# Patient Record
Sex: Female | Born: 1955 | Hispanic: No | Marital: Married | Smoking: Never smoker
Health system: Southern US, Community
[De-identification: ages and names within clinical notes are randomized; demographics above are authoritative.]

## PROBLEM LIST (undated history)

## (undated) DIAGNOSIS — Z789 Other specified health status: Secondary | ICD-10-CM

## (undated) HISTORY — PX: COSMETIC SURGERY: SHX468

## (undated) HISTORY — DX: Other specified health status: Z78.9

---

## 2020-08-24 ENCOUNTER — Other Ambulatory Visit: Payer: Self-pay

## 2020-08-24 ENCOUNTER — Encounter: Payer: Self-pay | Admitting: Family Medicine

## 2020-08-24 ENCOUNTER — Ambulatory Visit (INDEPENDENT_AMBULATORY_CARE_PROVIDER_SITE_OTHER): Payer: Medicare Other | Admitting: Family Medicine

## 2020-08-24 VITALS — BP 138/87 | HR 78 | Temp 97.8°F | Ht 61.0 in | Wt 189.4 lb

## 2020-08-24 DIAGNOSIS — L819 Disorder of pigmentation, unspecified: Secondary | ICD-10-CM | POA: Diagnosis not present

## 2020-08-24 DIAGNOSIS — Z1211 Encounter for screening for malignant neoplasm of colon: Secondary | ICD-10-CM | POA: Diagnosis not present

## 2020-08-24 DIAGNOSIS — Z1212 Encounter for screening for malignant neoplasm of rectum: Secondary | ICD-10-CM

## 2020-08-24 DIAGNOSIS — Z6835 Body mass index (BMI) 35.0-35.9, adult: Secondary | ICD-10-CM | POA: Diagnosis not present

## 2020-08-24 DIAGNOSIS — K5900 Constipation, unspecified: Secondary | ICD-10-CM

## 2020-08-24 DIAGNOSIS — R030 Elevated blood-pressure reading, without diagnosis of hypertension: Secondary | ICD-10-CM | POA: Insufficient documentation

## 2020-08-24 DIAGNOSIS — Z780 Asymptomatic menopausal state: Secondary | ICD-10-CM | POA: Insufficient documentation

## 2020-08-24 NOTE — Progress Notes (Signed)
Subjective:  Patient ID: Ann Valdez, female    DOB: [birth year], 65 y.o.   MRN: [id number]  Patient Care Team: Baruch Gouty, FNP as PCP - General (Family Medicine)   Chief Complaint:  New Patient (Initial Visit) and Establish Care   HPI: Ann Valdez is a 65 y.o. female presenting on 08/24/2020 for New Patient (Initial Visit) and Establish Care   Pt is a pleasant 65 year old female who presents today to establish care with PCP. She states she has not seen a provider in several years. She reports she is very healthy overall and does not take any medications and does not wish to start unless absolutely necessary. Past surgeries include tummy tuck and breast reduction. She reports having a mammogram several years ago and declines further mammograms. She reports several areas of skin discoloration and lesions that have worsened over the last several years and would like to be referred to dermatology for further evaluation. States she has constipation and at times she has rectal pain with her bowel movements. Has not had a colonoscopy in last 10 years. Would like to be referred to GI for evaluation of constipation and rectal pain and for colonoscopy. She has not had a recent PAP but would like to see GYN for this. Aware this could be completed in office but pt insists on referral. She is postmenopausal, last menses age 72. No HRT and has not had DEXA scan.  She declines immunizations. No other complaints or concerns today.     Relevant past medical, surgical, family, and social history reviewed and updated as indicated.  Allergies and medications reviewed and updated. Date reviewed: Chart in Epic.   History reviewed. No pertinent past medical history.  Past Surgical History:  Procedure Laterality Date   COSMETIC SURGERY      Social History   Socioeconomic History   Marital status: Married    Spouse name: Not on file   Number of children: 3   Years of education: Not on  file   Highest education level: Not on file  Occupational History   Not on file  Tobacco Use   Smoking status: Never   Smokeless tobacco: Never  Vaping Use   Vaping Use: Never used  Substance and Sexual Activity   Alcohol use: Never   Drug use: Never   Sexual activity: Not on file  Other Topics Concern   Not on file  Social History Narrative   Not on file   Social Determinants of Health   Financial Resource Strain: Not on file  Food Insecurity: Not on file  Transportation Needs: Not on file  Physical Activity: Not on file  Stress: Not on file  Social Connections: Not on file  Intimate Partner Violence: Not on file    No outpatient encounter medications on file as of 08/24/2020.   No facility-administered encounter medications on file as of 08/24/2020.    Allergies  Allergen Reactions   Ciprofloxacin Rash    Review of Systems  Constitutional:  Negative for activity change, appetite change, chills, diaphoresis, fatigue, fever and unexpected weight change.  HENT: Negative.    Eyes: Negative.   Respiratory:  Negative for cough, chest tightness and shortness of breath.   Cardiovascular:  Negative for chest pain, palpitations and leg swelling.  Gastrointestinal:  Positive for constipation and rectal pain. Negative for abdominal distention, abdominal pain, anal bleeding, blood in stool, diarrhea, nausea and vomiting.  Endocrine: Negative.   Genitourinary:  Negative for  decreased urine volume, difficulty urinating, dyspareunia, dysuria, enuresis, flank pain, frequency, genital sores, hematuria, menstrual problem, pelvic pain, urgency, vaginal bleeding, vaginal discharge and vaginal pain.  Musculoskeletal:  Negative for arthralgias and myalgias.  Skin:  Positive for color change. Negative for pallor, rash and wound.  Allergic/Immunologic: Negative.   Neurological:  Negative for dizziness and headaches.  Hematological: Negative.   Psychiatric/Behavioral:  Negative for  confusion, hallucinations, sleep disturbance and suicidal ideas.   All other systems reviewed and are negative.      Objective:  BP 138/87   Pulse 78   Temp 97.8 F (36.6 C) (Temporal)   Ht '5\' 1"'  (1.549 m)   Wt 189 lb 6.4 oz (85.9 kg)   SpO2 97%   BMI 35.79 kg/m    Wt Readings from Last 3 Encounters:  08/24/20 189 lb 6.4 oz (85.9 kg)    Physical Exam Vitals and nursing note reviewed. Chaperone present: declined breast exam.  Constitutional:      General: She is not in acute distress.    Appearance: Normal appearance. She is well-developed and well-groomed. She is obese. She is not ill-appearing, toxic-appearing or diaphoretic.  HENT:     Head: Normocephalic and atraumatic.     Jaw: There is normal jaw occlusion.     Right Ear: Hearing, tympanic membrane, ear canal and external ear normal.     Left Ear: Hearing, tympanic membrane, ear canal and external ear normal.     Nose: Nose normal.     Mouth/Throat:     Lips: Pink.     Mouth: Mucous membranes are moist.     Pharynx: Oropharynx is clear. Uvula midline.  Eyes:     General: Lids are normal.     Extraocular Movements: Extraocular movements intact.     Conjunctiva/sclera: Conjunctivae normal.     Pupils: Pupils are equal, round, and reactive to light.  Neck:     Thyroid: No thyroid mass, thyromegaly or thyroid tenderness.     Vascular: No carotid bruit or JVD.     Trachea: Trachea and phonation normal.  Cardiovascular:     Rate and Rhythm: Normal rate and regular rhythm.     Chest Wall: PMI is not displaced.     Pulses: Normal pulses.     Heart sounds: Normal heart sounds. No murmur heard.   No friction rub. No gallop.  Pulmonary:     Effort: Pulmonary effort is normal. No respiratory distress.     Breath sounds: Normal breath sounds. No wheezing.  Abdominal:     General: Bowel sounds are normal. There is no distension or abdominal bruit.     Palpations: Abdomen is soft. There is no hepatomegaly, splenomegaly or  mass.     Tenderness: There is no abdominal tenderness. There is no right CVA tenderness, left CVA tenderness, guarding or rebound.     Hernia: No hernia is present.  Genitourinary:    Comments: Declined exam Musculoskeletal:        General: Normal range of motion.     Cervical back: Normal range of motion and neck supple.     Right lower leg: No edema.     Left lower leg: No edema.  Lymphadenopathy:     Cervical: No cervical adenopathy.  Skin:    General: Skin is warm and dry.     Capillary Refill: Capillary refill takes less than 2 seconds.     Coloration: Skin is not cyanotic, jaundiced or pale.     Findings:  Lesion present. No abrasion, abscess, acne, bruising, burn, ecchymosis, erythema, signs of injury, laceration, petechiae, rash or wound.     Comments: Scattered raised skin lesions to back, variations in color. Discoloration to bilateral forearms.   Neurological:     General: No focal deficit present.     Mental Status: She is alert and oriented to person, place, and time.     Cranial Nerves: Cranial nerves are intact. No cranial nerve deficit.     Sensory: Sensation is intact. No sensory deficit.     Motor: Motor function is intact. No weakness.     Coordination: Coordination is intact. Coordination normal.     Gait: Gait is intact. Gait normal.     Deep Tendon Reflexes: Reflexes are normal and symmetric. Reflexes normal.  Psychiatric:        Attention and Perception: Attention and perception normal.        Mood and Affect: Mood and affect normal.        Speech: Speech normal.        Behavior: Behavior normal. Behavior is cooperative.        Thought Content: Thought content normal.        Cognition and Memory: Cognition and memory normal.        Judgment: Judgment normal.     Pertinent labs & imaging results that were available during my care of the patient were reviewed by me and considered in my medical decision making.  Assessment & Plan:  Ann Valdez was seen today for  new patient (initial visit) and establish care.  Diagnoses and all orders for this visit:  Elevated blood-pressure reading, without diagnosis of hypertension Repeat manual BP 138/84. DASH diet and exercise encouraged. Declined EKG in office today. Will obtain below labs. Pt aware to monitor BP and report any persistent high readings.  -     CBC with Differential/Platelet -     CMP14+EGFR -     Lipid panel -     Thyroid Panel With TSH  BMI 35.0-35.9,adult Diet and exercise encouraged. Will obtain below labs.  -     CBC with Differential/Platelet -     CMP14+EGFR -     Lipid panel -     Thyroid Panel With TSH  Postmenopausal Will obtain DEXA. Referral to GYN per pts request for PAP.  -     DG [facility] DEXA -     Ambulatory referral to Gynecology  Discoloration of skin of multiple sites Discoloration to bilateral forearms appear to be solar lentigo. Discolored lesions to back appear to be seborrheic keratosis. Referral to derm for evaluation per pt request.  -     Ambulatory referral to Dermatology  Screening for colorectal cancer Constipation, unspecified constipation type Constipation with rectal pain, high fiber diet discussed in detail. Will refer to GI for evaluation. Can make referral to colorectal surgeon if warranted. Also needs colonoscopy.  -     Ambulatory referral to Gastroenterology     Continue all other maintenance medications.  Follow up plan: Return in about 6 months (around 02/24/2021), or if symptoms worsen or fail to improve.   Continue healthy lifestyle choices, including diet (rich in fruits, vegetables, and lean proteins, and low in salt and simple carbohydrates) and exercise (at least 30 minutes of moderate physical activity daily).  Educational handout given for DASH diet and health maintenance  The above assessment and management plan was discussed with the patient. The patient verbalized understanding of and has agreed to  the management plan. Patient is  aware to call the clinic if they develop any new symptoms or if symptoms persist or worsen. Patient is aware when to return to the clinic for a follow-up visit. Patient educated on when it is appropriate to go to the emergency department.   Monia Pouch, FNP-C [facility] [phone]

## 2020-08-24 NOTE — Patient Instructions (Signed)
DASH Eating Plan DASH stands for "Dietary Approaches to Stop Hypertension." The DASH eating plan is a healthy eating plan that has been shown to reduce high blood pressure (hypertension). Additional health benefits may include reducing the risk of type 2 diabetes mellitus, heart disease, and stroke. The DASH eating plan may also help with weight loss.  WHAT DO I NEED TO KNOW ABOUT THE DASH EATING PLAN? For the DASH eating plan, you will follow these general guidelines:  Choose foods with a percent daily value for sodium of less than 5% (as listed on the food label).  Use salt-free seasonings or herbs instead of table salt or sea salt.  Check with your health care provider or pharmacist before using salt substitutes.  Eat lower-sodium products, often labeled as "lower sodium" or "no salt added."  Eat fresh foods.  Eat more vegetables, fruits, and low-fat dairy products.  Choose whole grains. Look for the word "whole" as the first word in the ingredient list.  Choose fish and skinless chicken or turkey more often than red meat. Limit fish, poultry, and meat to 6 oz (170 g) each day.  Limit sweets, desserts, sugars, and sugary drinks.  Choose heart-healthy fats.  Limit cheese to 1 oz (28 g) per day.  Eat more home-cooked food and less restaurant, buffet, and fast food.  Limit fried foods.  Cook foods using methods other than frying.  Limit canned vegetables. If you do use them, rinse them well to decrease the sodium.  When eating at a restaurant, ask that your food be prepared with less salt, or no salt if possible.  WHAT FOODS CAN I EAT? Seek help from a dietitian for individual calorie needs.  Grains Whole grain or whole wheat bread. Brown rice. Whole grain or whole wheat pasta. Quinoa, bulgur, and whole grain cereals. Low-sodium cereals. Corn or whole wheat flour tortillas. Whole grain cornbread. Whole grain crackers. Low-sodium crackers.  Vegetables Fresh or frozen  vegetables (raw, steamed, roasted, or grilled). Low-sodium or reduced-sodium tomato and vegetable juices. Low-sodium or reduced-sodium tomato sauce and paste. Low-sodium or reduced-sodium canned vegetables.   Fruits All fresh, canned (in natural juice), or frozen fruits.  Meat and Other Protein Products Ground beef (85% or leaner), grass-fed beef, or beef trimmed of fat. Skinless chicken or turkey. Ground chicken or turkey. Pork trimmed of fat. All fish and seafood. Eggs. Dried beans, peas, or lentils. Unsalted nuts and seeds. Unsalted canned beans.  Dairy Low-fat dairy products, such as skim or 1% milk, 2% or reduced-fat cheeses, low-fat ricotta or cottage cheese, or plain low-fat yogurt. Low-sodium or reduced-sodium cheeses.  Fats and Oils Tub margarines without trans fats. Light or reduced-fat mayonnaise and salad dressings (reduced sodium). Avocado. Safflower, olive, or canola oils. Natural peanut or almond butter.  Other Unsalted popcorn and pretzels. The items listed above may not be a complete list of recommended foods or beverages. Contact your dietitian for more options.  WHAT FOODS ARE NOT RECOMMENDED?  Grains White bread. White pasta. White rice. Refined cornbread. Bagels and croissants. Crackers that contain trans fat.  Vegetables Creamed or fried vegetables. Vegetables in a cheese sauce. Regular canned vegetables. Regular canned tomato sauce and paste. Regular tomato and vegetable juices.  Fruits Dried fruits. Canned fruit in light or heavy syrup. Fruit juice.  Meat and Other Protein Products Fatty cuts of meat. Ribs, chicken wings, bacon, sausage, bologna, salami, chitterlings, fatback, hot dogs, bratwurst, and packaged luncheon meats. Salted nuts and seeds. Canned beans with salt.    Dairy Whole or 2% milk, cream, half-and-half, and cream cheese. Whole-fat or sweetened yogurt. Full-fat cheeses or blue cheese. Nondairy creamers and whipped toppings. Processed cheese,  cheese spreads, or cheese curds.  Condiments Onion and garlic salt, seasoned salt, table salt, and sea salt. Canned and packaged gravies. Worcestershire sauce. Tartar sauce. Barbecue sauce. Teriyaki sauce. Soy sauce, including reduced sodium. Steak sauce. Fish sauce. Oyster sauce. Cocktail sauce. Horseradish. Ketchup and mustard. Meat flavorings and tenderizers. Bouillon cubes. Hot sauce. Tabasco sauce. Marinades. Taco seasonings. Relishes.  Fats and Oils Butter, stick margarine, lard, shortening, ghee, and bacon fat. Coconut, palm kernel, or palm oils. Regular salad dressings.  Other Pickles and olives. Salted popcorn and pretzels.  The items listed above may not be a complete list of foods and beverages to avoid. Contact your dietitian for more information.  WHERE CAN I FIND MORE INFORMATION? National Heart, Lung, and [employer] Institute: www.nhlbi.nih.gov/health/health-topics/topics/dash/ Document Released: 12/21/2010 Document Revised: 05/18/2013 Document Reviewed: 11/05/2012 Exit[employer] Patient Information 2015 [employer]. This information is not intended to replace advice given to you by your health care provider. Make sure you discuss any questions you have with your health care provider.   I think that you would greatly benefit from seeing a nutritionist.  If you are interested, please call Dr Sykes at [phone] to schedule an appointment.   

## 2020-08-25 LAB — LIPID PANEL
Chol/HDL Ratio: 3.6 ratio (ref 0.0–4.4)
Cholesterol, Total: 277 mg/dL — ABNORMAL HIGH (ref 100–199)
HDL: 78 mg/dL (ref >39)
LDL Chol Calc (NIH): 184 mg/dL — ABNORMAL HIGH (ref 0–99)
Triglycerides: 90 mg/dL (ref 0–149)
VLDL Cholesterol Cal: 15 mg/dL (ref 5–40)

## 2020-08-25 LAB — CMP14+EGFR
ALT: 27 IU/L (ref 0–32)
AST: 20 IU/L (ref 0–40)
Albumin/Globulin Ratio: 1.9 (ref 1.2–2.2)
Albumin: 4.4 g/dL (ref 3.8–4.8)
Alkaline Phosphatase: 62 IU/L (ref 44–121)
BUN/Creatinine Ratio: 17 (ref 12–28)
BUN: 14 mg/dL (ref 8–27)
Bilirubin Total: 0.3 mg/dL (ref 0.0–1.2)
CO2: 22 mmol/L (ref 20–29)
Calcium: 9.6 mg/dL (ref 8.7–10.3)
Chloride: 105 mmol/L (ref 96–106)
Creatinine, Ser: 0.81 mg/dL (ref 0.57–1.00)
Globulin, Total: 2.3 g/dL (ref 1.5–4.5)
Glucose: 105 mg/dL — ABNORMAL HIGH (ref 65–99)
Potassium: 4.7 mmol/L (ref 3.5–5.2)
Sodium: 143 mmol/L (ref 134–144)
Total Protein: 6.7 g/dL (ref 6.0–8.5)
eGFR: 81 mL/min/{1.73_m2} (ref >59)

## 2020-08-25 LAB — CBC WITH DIFFERENTIAL/PLATELET
Basophils Absolute: 0 10*3/uL (ref 0.0–0.2)
Basos: 1 %
EOS (ABSOLUTE): 0.2 10*3/uL (ref 0.0–0.4)
Eos: 3 %
Hematocrit: 49.3 % — ABNORMAL HIGH (ref 34.0–46.6)
Hemoglobin: 14.9 g/dL (ref 11.1–15.9)
Immature Grans (Abs): 0 10*3/uL (ref 0.0–0.1)
Immature Granulocytes: 0 %
Lymphocytes Absolute: 2.4 10*3/uL (ref 0.7–3.1)
Lymphs: 34 %
MCH: 26.3 pg — ABNORMAL LOW (ref 26.6–33.0)
MCHC: 30.2 g/dL — ABNORMAL LOW (ref 31.5–35.7)
MCV: 87 fL (ref 79–97)
Monocytes Absolute: 0.5 10*3/uL (ref 0.1–0.9)
Monocytes: 7 %
Neutrophils Absolute: 4 10*3/uL (ref 1.4–7.0)
Neutrophils: 55 %
Platelets: 280 10*3/uL (ref 150–450)
RBC: 5.67 x10E6/uL — ABNORMAL HIGH (ref 3.77–5.28)
RDW: 13.9 % (ref 11.7–15.4)
WBC: 7.2 10*3/uL (ref 3.4–10.8)

## 2020-08-25 LAB — THYROID PANEL WITH TSH
Free Thyroxine Index: 1.7 (ref 1.2–4.9)
T3 Uptake Ratio: 25 % (ref 24–39)
T4, Total: 6.6 ug/dL (ref 4.5–12.0)
TSH: 4.64 u[IU]/mL — ABNORMAL HIGH (ref 0.450–4.500)

## 2020-08-26 ENCOUNTER — Ambulatory Visit (INDEPENDENT_AMBULATORY_CARE_PROVIDER_SITE_OTHER): Payer: Medicare Other

## 2020-08-26 ENCOUNTER — Other Ambulatory Visit: Payer: Self-pay

## 2020-08-26 DIAGNOSIS — Z780 Asymptomatic menopausal state: Secondary | ICD-10-CM

## 2020-08-29 ENCOUNTER — Encounter: Payer: Self-pay | Admitting: Gastroenterology

## 2020-08-29 DIAGNOSIS — Z780 Asymptomatic menopausal state: Secondary | ICD-10-CM | POA: Diagnosis not present

## 2020-09-21 DIAGNOSIS — Z1283 Encounter for screening for malignant neoplasm of skin: Secondary | ICD-10-CM | POA: Diagnosis not present

## 2020-09-21 DIAGNOSIS — L304 Erythema intertrigo: Secondary | ICD-10-CM | POA: Diagnosis not present

## 2020-09-21 DIAGNOSIS — L818 Other specified disorders of pigmentation: Secondary | ICD-10-CM | POA: Diagnosis not present

## 2020-09-21 DIAGNOSIS — D225 Melanocytic nevi of trunk: Secondary | ICD-10-CM | POA: Diagnosis not present

## 2020-09-27 ENCOUNTER — Encounter: Payer: Medicare Other | Admitting: Obstetrics & Gynecology

## 2020-10-11 ENCOUNTER — Encounter: Payer: Medicare Other | Admitting: Obstetrics & Gynecology

## 2020-10-14 ENCOUNTER — Ambulatory Visit: Payer: Medicare Other | Admitting: Obstetrics & Gynecology

## 2020-10-14 ENCOUNTER — Other Ambulatory Visit: Payer: Self-pay

## 2020-10-14 ENCOUNTER — Other Ambulatory Visit (HOSPITAL_COMMUNITY)
Admission: RE | Admit: 2020-10-14 | Discharge: 2020-10-14 | Disposition: A | Discharge status: Home / Self Care | Payer: Medicare Other | Source: Ambulatory Visit | Attending: Obstetrics & Gynecology | Admitting: Obstetrics & Gynecology

## 2020-10-14 ENCOUNTER — Encounter: Payer: Self-pay | Admitting: Obstetrics & Gynecology

## 2020-10-14 VITALS — BP 163/83 | HR 75 | Ht 61.0 in | Wt 192.0 lb

## 2020-10-14 DIAGNOSIS — Z124 Encounter for screening for malignant neoplasm of cervix: Secondary | ICD-10-CM | POA: Insufficient documentation

## 2020-10-14 DIAGNOSIS — N816 Rectocele: Secondary | ICD-10-CM | POA: Diagnosis not present

## 2020-10-14 DIAGNOSIS — K5909 Other constipation: Secondary | ICD-10-CM | POA: Diagnosis not present

## 2020-10-14 DIAGNOSIS — Z1151 Encounter for screening for human papillomavirus (HPV): Secondary | ICD-10-CM | POA: Insufficient documentation

## 2020-10-14 DIAGNOSIS — Z01419 Encounter for gynecological examination (general) (routine) without abnormal findings: Secondary | ICD-10-CM | POA: Diagnosis present

## 2020-10-14 MED ORDER — BISACODYL EC 5 MG PO TBEC: 5.0000 mg | DELAYED_RELEASE_TABLET | Freq: Every day | ORAL | 99 refills | Status: AC | PRN

## 2020-10-14 MED ORDER — POLYETHYLENE GLYCOL 3350 17 GM/SCOOP PO POWD: ORAL | 11 refills | Status: DC

## 2020-10-14 NOTE — Progress Notes (Signed)
Chief Complaint  Patient presents with   new gyn    postmenpausal      65 y.o. G3P0 No LMP recorded. The current method of family planning is menopause  Outpatient Encounter Medications as of 10/14/2020  Medication Sig   bisacodyl 5 MG EC tablet Take 1 tablet (5 mg total) by mouth daily as needed for moderate constipation.   polyethylene glycol powder (GLYCOLAX/MIRALAX) 17 GM/SCOOP powder 1 scoop twice daily   No facility-administered encounter medications on file as of 10/14/2020.    Subjective Pt with long standing history of constipation She has used an enema every day for 42 years, 1 liter warm water Has noticed increase rectal bulging recently History reviewed. No pertinent past medical history.  Past Surgical History:  Procedure Laterality Date   COSMETIC SURGERY      OB History     Gravida  3   Para      Term      Preterm      AB      Living  3      SAB      IAB      Ectopic      Multiple      Live Births              Allergies  Allergen Reactions   Ciprofloxacin Rash    Social History   Socioeconomic History   Marital status: Married    Spouse name: Not on file   Number of children: 3   Years of education: Not on file   Highest education level: Not on file  Occupational History   Not on file  Tobacco Use   Smoking status: Never   Smokeless tobacco: Never  Vaping Use   Vaping Use: Never used  Substance and Sexual Activity   Alcohol use: Never   Drug use: Never   Sexual activity: Yes    Birth control/protection: None  Other Topics Concern   Not on file  Social History Narrative   Not on file   Social Determinants of Health   Financial Resource Strain: Low Risk    Difficulty of Paying Living Expenses: Not hard at all  Food Insecurity: No Food Insecurity   Worried About Charity fundraiser in the Last Year: Never true   Ran Out of Food in the Last Year: Never true  Transportation Needs: No Transportation  Needs   Lack of Transportation (Medical): No   Lack of Transportation (Non-Medical): No  Physical Activity: Insufficiently Active   Days of Exercise per Week: 1 day   Minutes of Exercise per Session: 10 min  Stress: No Stress Concern Present   Feeling of Stress : Not at all  Social Connections: Unknown   Frequency of Communication with Friends and Family: More than three times a week   Frequency of Social Gatherings with Friends and Family: Once a week   Attends Religious Services: Patient refused   Marine scientist or Organizations: No   Attends Archivist Meetings: Never   Marital Status: Married    History reviewed. No pertinent family history.  Medications:       Current Outpatient Medications:    bisacodyl 5 MG EC tablet, Take 1 tablet (5 mg total) by mouth daily as needed for moderate constipation., Disp: 30 tablet, Rfl: PRN   polyethylene glycol powder (GLYCOLAX/MIRALAX) 17 GM/SCOOP powder, 1 scoop twice daily, Disp: 255 g, Rfl: 11  Objective Blood  pressure (!) 163/83, pulse 75, height 5\' 1"  (1.549 m), weight 192 lb (87.1 kg).  General WDWN female NAD Vulva:  normal appearing vulva with no masses, tenderness or lesions, mild rectocoele Vagina:  normal mucosa, no discharge Cervix:  Normal no lesions Uterus:  normal size, contour, position, consistency, mobility, non-tender Adnexa: ovaries:present,  normal adnexa in size, nontender and no masses    Pertinent ROS No burning with urination, frequency or urgency No nausea, vomiting or diarrhea Nor fever chills or other constitutional symptoms   Labs or studies     Impression Diagnoses this Encounter::   ICD-10-CM   1. Chronic constipation  K59.09    Pt has used a 1 liter enema every day for the past 42 years    2. Pap smear for cervical cancer screening  Z12.4 Cytology - PAP( )    3. Rectocele, secondary to chronic constipation  N81.6       Established relevant  diagnosis(es):   Plan/Recommendations: Meds ordered this encounter  Medications   bisacodyl 5 MG EC tablet    Sig: Take 1 tablet (5 mg total) by mouth daily as needed for moderate constipation.    Dispense:  30 tablet    Refill:  PRN   polyethylene glycol powder (GLYCOLAX/MIRALAX) 17 GM/SCOOP powder    Sig: 1 scoop twice daily    Dispense:  255 g    Refill:  11    Labs or Scans Ordered: No orders of the defined types were placed in this encounter.   Management:: Miralax titrated to effect Oral dulcolax 1 per day for 1 month Dulcolax suppository prn   Follow up Return in about 1 month (around 11/13/2020) for Follow up, with Dr Elonda Husky.      All questions were answered.

## 2020-10-18 LAB — CYTOLOGY - PAP
Comment: NEGATIVE
Diagnosis: NEGATIVE
High risk HPV: NEGATIVE

## 2020-11-21 ENCOUNTER — Ambulatory Visit: Payer: Medicare Other | Admitting: Obstetrics & Gynecology

## 2020-12-13 ENCOUNTER — Ambulatory Visit (INDEPENDENT_AMBULATORY_CARE_PROVIDER_SITE_OTHER): Payer: Medicare Other | Admitting: *Deleted

## 2020-12-13 DIAGNOSIS — Z0000 Encounter for general adult medical examination without abnormal findings: Secondary | ICD-10-CM

## 2020-12-13 NOTE — Progress Notes (Signed)
MEDICARE ANNUAL WELLNESS VISIT  12/13/2020  Telephone Visit Disclaimer This Medicare AWV was conducted by telephone due to national recommendations for restrictions regarding the COVID-19 Pandemic (e.g. social distancing).  I verified, using two identifiers, that I am speaking with Ann Valdez or their authorized healthcare agent. I discussed the limitations, risks, security, and privacy concerns of performing an evaluation and management service by telephone and the potential availability of an in-person appointment in the future. The patient expressed understanding and agreed to proceed.  Location of Patient: Home Location of Provider (nurse):  office  Subjective:    Ann Valdez is a 65 y.o. female patient of Rakes, Connye Burkitt, FNP who had a Medicare Annual Wellness Visit today via telephone. Ann Valdez is Retired and lives with their spouse. she has 3 children. she reports that she is socially active and does interact with friends/family regularly. she is minimally physically active and enjoys cooking.  Patient Care Team: Baruch Gouty, FNP as PCP - General (Family Medicine)  Advanced Directives 12/13/2020  Does Patient Have a Medical Advance Directive? No  Would patient like information on creating a medical advance directive? No - Patient declined    Hospital Utilization Over the Past 12 Months: # of hospitalizations or ER visits: 0 # of surgeries: 0  Review of Systems    Patient reports that her overall health is unchanged compared to last year.  History obtained from chart review and the patient  Patient Reported Readings (BP, Pulse, CBG, Weight, etc) none  Pain Assessment Pain : No/denies pain     Current Medications & Allergies (verified) Allergies as of 12/13/2020       Reactions   Ciprofloxacin Rash        Medication List        Accurate as of December 13, 2020 11:08 AM. If you have any questions, ask your nurse or doctor.           bisacodyl 5 MG EC tablet Generic drug: bisacodyl Take 1 tablet (5 mg total) by mouth daily as needed for moderate constipation.   polyethylene glycol powder 17 GM/SCOOP powder Commonly known as: GLYCOLAX/MIRALAX 1 scoop twice daily        History (reviewed): History reviewed. No pertinent past medical history. Past Surgical History:  Procedure Laterality Date   COSMETIC SURGERY     History reviewed. No pertinent family history. Social History   Socioeconomic History   Marital status: Married    Spouse name: Not on file   Number of children: 3   Years of education: Not on file   Highest education level: 12th grade  Occupational History   Not on file  Tobacco Use   Smoking status: Never   Smokeless tobacco: Never  Vaping Use   Vaping Use: Never used  Substance and Sexual Activity   Alcohol use: Never   Drug use: Never   Sexual activity: Yes    Birth control/protection: None  Other Topics Concern   Not on file  Social History Narrative   Not on file   Social Determinants of Health   Financial Resource Strain: Low Risk    Difficulty of Paying Living Expenses: Not hard at all  Food Insecurity: No Food Insecurity   Worried About Charity fundraiser in the Last Year: Never true   [place] in the Last Year: Never true  Transportation Needs: No Transportation Needs   Lack of Transportation (Medical): No   Lack  of Transportation (Non-Medical): No  Physical Activity: Insufficiently Active   Days of Exercise per Week: 3 days   Minutes of Exercise per Session: 10 min  Stress: No Stress Concern Present   Feeling of Stress : Not at all  Social Connections: Moderately Integrated   Frequency of Communication with Friends and Family: More than three times a week   Frequency of Social Gatherings with Friends and Family: More than three times a week   Attends Religious Services: 1 to 4 times per year   Active Member of [employer] or Organizations: No   Attends English as a second language teacher Meetings: Never   Marital Status: Married    Activities of Daily Living In your present state of health, do you have any difficulty performing the following activities: 12/13/2020  Hearing? N  Vision? N  Difficulty concentrating or making decisions? N  Walking or climbing stairs? N  Dressing or bathing? N  Doing errands, shopping? N  Preparing Food and eating ? N  Using the Toilet? N  In the past six months, have you accidently leaked urine? N  Do you have problems with loss of bowel control? N  Managing your Medications? N  Managing your Finances? N  Housekeeping or managing your Housekeeping? N    Patient Education/ Literacy How often do you need to have someone help you when you read instructions, pamphlets, or other written materials from your doctor or pharmacy?: 1 - Never What is the last grade level you completed in school?: 12  Exercise Current Exercise Habits: Home exercise routine, Type of exercise: walking, Time (Minutes): 10, Frequency (Times/Week): 3, Weekly Exercise (Minutes/Week): 30, Intensity: Mild, Exercise limited by: None identified  Diet Patient reports consuming 3 meals a day and 1 snack(s) a day Patient reports that her primary diet is: Regular Patient reports that she does have regular access to food.   Depression Screen PHQ 2/9 Scores 12/13/2020 12/13/2020 10/14/2020 08/24/2020  PHQ - 2 Score 0 0 0 0  PHQ- 9 Score - - 0 -     Fall Risk Fall Risk  12/13/2020 10/14/2020 08/24/2020  Falls in the past year? 0 0 0  Number falls in past yr: - 0 -  Injury with Fall? - 0 -     Objective:  Ann Valdez seemed alert and oriented and she participated appropriately during our telephone visit.  Blood Pressure Weight BMI  BP Readings from Last 3 Encounters:  10/14/20 (!) 163/83  08/24/20 138/87   Wt Readings from Last 3 Encounters:  10/14/20 192 lb (87.1 kg)  08/24/20 189 lb 6.4 oz (85.9 kg)   BMI Readings from Last 1 Encounters:   10/14/20 36.28 kg/m    *Unable to obtain current vital signs, weight, and BMI due to telephone visit type  Hearing/Vision  Ann Valdez did not seem to have difficulty with hearing/understanding during the telephone conversation Reports that she has not had a formal eye exam by an eye care professional within the past year Reports that she has not had a formal hearing evaluation within the past year *Unable to fully assess hearing and vision during telephone visit type  Cognitive Function: 6CIT Screen 12/13/2020  What Year? 0 points  What month? 0 points  What time? 0 points  Count back from 20 0 points  Months in reverse 0 points  Repeat phrase 0 points  Total Score 0   (Normal:0-7, Significant for Dysfunction: >8)  Normal Cognitive Function Screening: Yes   Immunization & Health Maintenance  Record Immunization History  Administered Date(s) Administered   PFIZER(Purple Top)SARS-COV-2 Vaccination 09/18/2019, 10/09/2019    Health Maintenance  Topic Date Due   COLONOSCOPY (Pts 45-41yrs Insurance coverage will need to be confirmed)  Never done   Zoster Vaccines- Shingrix (1 of 2) Never done   COVID-19 Vaccine (3 - Booster for Pfizer series) 12/04/2019   Pneumonia Vaccine 35+ Years old (1 - PCV) Never done   INFLUENZA VACCINE  Never done   MAMMOGRAM  08/24/2021 (Originally 08/05/2005)   TETANUS/TDAP  08/24/2021 (Originally 08/06/1974)   Hepatitis C Screening  08/24/2021 (Originally 08/05/1973)   HIV Screening  08/24/2021 (Originally 08/06/1970)   PAP SMEAR-Modifier  10/15/2023   DEXA SCAN  Completed   HPV VACCINES  Aged Out       Assessment  This is a routine wellness examination for Ann Valdez.  Health Maintenance: Due or Overdue Health Maintenance Due  Topic Date Due   COLONOSCOPY (Pts 45-81yrs Insurance coverage will need to be confirmed)  Never done   Zoster Vaccines- Shingrix (1 of 2) Never done   COVID-19 Vaccine (3 - Booster for Pfizer series) 12/04/2019    Pneumonia Vaccine 53+ Years old (1 - PCV) Never done   INFLUENZA VACCINE  Never done    Ann Valdez does not need a referral for Community Assistance: Care Management:   no Social Work:    no Prescription Assistance:  no Nutrition/Diabetes Education:  no   Plan:  Personalized Goals  Goals Addressed             This Visit's Progress    Weight (lb) < 175 lb (79.4 kg)         Personalized Health Maintenance & Screening Recommendations  Colorectal cancer screening  Lung Cancer Screening Recommended: no (Low Dose CT Chest recommended if Age 65-80 years, 30 pack-year currently smoking OR have quit w/in past 15 years) Hepatitis C Screening recommended: pt declined HIV Screening recommended pt declined  Advanced Directives: Written information was not prepared per patient's request.  Referrals & Orders No orders of the defined types were placed in this encounter.   Follow-up Plan Follow-up with Baruch Gouty, FNP as  on 02/21/21 Pt very healthy and only takes a stool softener Pt declines flu, pneumonia and shingrix vacines. Declined advanced directive information. No hearing or vision problems Independent with all ADLs Pt has colonoscopy consult next month. AVS mailed to patient   I have personally reviewed and noted the following in the patient's chart:   Medical and social history Use of alcohol, tobacco or illicit drugs  Current medications and supplements Functional ability and status Nutritional status Physical activity Advanced directives List of other physicians Hospitalizations, surgeries, and ER visits in previous 12 months Vitals Screenings to include cognitive, depression, and falls Referrals and appointments  In addition, I have reviewed and discussed with Ann Valdez certain preventive protocols, quality metrics, and best practice recommendations. A written personalized care plan for preventive services as well as general preventive  health recommendations is available and can be mailed to the patient at her request.      Rana Snare, LPN [id number]

## 2020-12-28 ENCOUNTER — Telehealth: Payer: Self-pay | Admitting: *Deleted

## 2020-12-28 ENCOUNTER — Other Ambulatory Visit: Payer: Self-pay

## 2020-12-28 ENCOUNTER — Ambulatory Visit: Payer: Medicare Other | Admitting: Gastroenterology

## 2020-12-28 ENCOUNTER — Encounter: Payer: Self-pay | Admitting: Gastroenterology

## 2020-12-28 DIAGNOSIS — K5900 Constipation, unspecified: Secondary | ICD-10-CM | POA: Diagnosis not present

## 2020-12-28 DIAGNOSIS — Z1211 Encounter for screening for malignant neoplasm of colon: Secondary | ICD-10-CM | POA: Insufficient documentation

## 2020-12-28 MED ORDER — PEG 3350-KCL-NA BICARB-NACL 420 G PO SOLR: ORAL | 0 refills | Status: DC

## 2020-12-28 NOTE — H&P (View-Only) (Signed)
Primary Care Physician:  Baruch Gouty, FNP Referring Physician: Darla Lesches, [place] Primary Gastroenterologist:  Dr. Gala Romney   Chief Complaint  Patient presents with   Constipation    HPI:   Ann Valdez is a 65 y.o. female presenting today at the request of Darla Lesches, [place], for screening colonoscopy. No prior colonoscopy. No family history of colorectal cancer or polyps.   She notes chronic constipation. Remote history of scant blood in stool but none recently, which occurred in presence of straining. No rectal pain, itching, burning. No abdominal pain. No weight loss or lack of appetite. No dysphagia. Takes dulcolax in evening and will have a BM in the morning. If doesn't take dulcolax, will not have a BM. Used to do enemas. Feels a pressure in vaginal area when needing to have a BM. Mild rectocele per GYN. Declining rectal exam today. Does not want prescriptive agents, as she feels dulcolax is helpful.   Past Medical History:  Diagnosis Date   Medical history non-contributory     Past Surgical History:  Procedure Laterality Date   COSMETIC SURGERY      Current Outpatient Medications  Medication Sig Dispense Refill   bisacodyl 5 MG EC tablet Take 1 tablet (5 mg total) by mouth daily as needed for moderate constipation. 30 tablet PRN   No current facility-administered medications for this visit.    Allergies as of 12/28/2020 - Review Complete 12/28/2020  Allergen Reaction Noted   Ciprofloxacin Rash 08/24/2020    Family History  Problem Relation Age of Onset   Colon cancer Neg Hx    Colon polyps Neg Hx     Social History   Socioeconomic History   Marital status: Married    Spouse name: Not on file   Number of children: 3   Years of education: Not on file   Highest education level: 12th grade  Occupational History   Not on file  Tobacco Use   Smoking status: Never   Smokeless tobacco: Never  Vaping Use   Vaping Use: Never used  Substance and Sexual  Activity   Alcohol use: Never   Drug use: Never   Sexual activity: Yes    Birth control/protection: None  Other Topics Concern   Not on file  Social History Narrative   Not on file   Social Determinants of Health   Financial Resource Strain: Low Risk    Difficulty of Paying Living Expenses: Not hard at all  Food Insecurity: No Food Insecurity   Worried About Charity fundraiser in the Last Year: Never true   [place] in the Last Year: Never true  Transportation Needs: No Transportation Needs   Lack of Transportation (Medical): No   Lack of Transportation (Non-Medical): No  Physical Activity: Insufficiently Active   Days of Exercise per Week: 3 days   Minutes of Exercise per Session: 10 min  Stress: No Stress Concern Present   Feeling of Stress : Not at all  Social Connections: Moderately Integrated   Frequency of Communication with Friends and Family: More than three times a week   Frequency of Social Gatherings with Friends and Family: More than three times a week   Attends Religious Services: 1 to 4 times per year   Active Member of [employer] or Organizations: No   Attends Archivist Meetings: Never   Marital Status: Married  Human resources officer Violence: Not At Risk   Fear of Current or Ex-Partner: No  Emotionally Abused: No   Physically Abused: No   Sexually Abused: No    Review of Systems: Gen: Denies any fever, chills, fatigue, weight loss, lack of appetite.  CV: Denies chest pain, heart palpitations, peripheral edema, syncope.  Resp: Denies shortness of breath at rest or with exertion. Denies wheezing or cough.  GI: see HPI GU : Denies urinary burning, urinary frequency, urinary hesitancy MS: Denies joint pain, muscle weakness, cramps, or limitation of movement.  Derm: Denies rash, itching, dry skin Psych: Denies depression, anxiety, memory loss, and confusion Heme: Denies bruising, bleeding, and enlarged lymph nodes.  Physical Exam: BP (!) 142/83     Pulse 74    Temp (!) 96.8 F (36 C) (Temporal)    Ht 5\' 1"  (1.549 m)    Wt 196 lb (88.9 kg)    BMI 37.03 kg/m  General:   Alert and oriented. Pleasant and cooperative. Well-nourished and well-developed.  Head:  Normocephalic and atraumatic. Eyes:  Without icterus, sclera clear and conjunctiva pink.  Ears:  Normal auditory acuity. Mouth:  mask in place Lungs:  Clear to auscultation bilaterally. No wheezes, rales, or rhonchi. No distress.  Heart:  S1, S2 present without murmurs appreciated.  Abdomen:  +BS, soft, non-tender and non-distended. No HSM noted. No guarding or rebound. No masses appreciated.  Rectal:  patient declined  Msk:  Symmetrical without gross deformities. Normal posture. Extremities:  Without edema. Neurologic:  Alert and  oriented x4;  grossly normal neurologically. Skin:  Intact without significant lesions or rashes. Psych:  Alert and cooperative. Normal mood and affect.  ASSESSMENT: Ann Valdez is a 65 y.o. female presenting today at the request of Darla Lesches, [employer], for screening colonoscopy. No prior colonoscopy. No family history of colorectal cancer or polyps. Chronic constipation noted.  Constipation: chronic. responds well to dulcolax. We will add fiber as well daily. Notes vaginal pressure when needing to have a BM. Mild rectocele per GYN. Declined rectal exam.    PLAN: Add Benefiber daily Continue dulcolax Proceed with colonoscopy by Dr. Gala Romney in near future: the risks, benefits, and alternatives have been discussed with the patient in detail. The patient states understanding and desires to proceed.   Annitta Needs, PhD, ANP-BC [facility]

## 2020-12-28 NOTE — Patient Instructions (Signed)
I recommend taking 2 teaspoons of Benefiber daily in the beverage of your choice. This is over-the-counter.  You can continue dulcolax as you are doing!  We are arranging a colonoscopy in the near future with Dr. Gala Romney!  It was a pleasure to see you today. I want to create trusting relationships with patients to provide genuine, compassionate, and quality care. I value your feedback. If you receive a survey regarding your visit,  I greatly appreciate you taking time to fill this out.   Annitta Needs, PhD, ANP-BC [facility]

## 2020-12-28 NOTE — Telephone Encounter (Signed)
Called pt. She has been scheduled for TCS with Dr. Gala Romney, conscious sedation on 12/28 at 7:30am. Aware will send rx prep to pharmacy and will mail prep instructions. Confirmed pharmacy and address.   PA approved via [facility]. Auth# [id number], DOS: Jan 11, 2021 - Jan 14, 2021

## 2020-12-28 NOTE — Progress Notes (Signed)
Primary Care Physician:  Baruch Gouty, FNP Referring Physician: Darla Lesches, [place] Primary Gastroenterologist:  Dr. Gala Romney   Chief Complaint  Patient presents with   Constipation    HPI:   Ann Valdez is a 65 y.o. female presenting today at the request of Darla Lesches, [place], for screening colonoscopy. No prior colonoscopy. No family history of colorectal cancer or polyps.   She notes chronic constipation. Remote history of scant blood in stool but none recently, which occurred in presence of straining. No rectal pain, itching, burning. No abdominal pain. No weight loss or lack of appetite. No dysphagia. Takes dulcolax in evening and will have a BM in the morning. If doesn't take dulcolax, will not have a BM. Used to do enemas. Feels a pressure in vaginal area when needing to have a BM. Mild rectocele per GYN. Declining rectal exam today. Does not want prescriptive agents, as she feels dulcolax is helpful.   Past Medical History:  Diagnosis Date   Medical history non-contributory     Past Surgical History:  Procedure Laterality Date   COSMETIC SURGERY      Current Outpatient Medications  Medication Sig Dispense Refill   bisacodyl 5 MG EC tablet Take 1 tablet (5 mg total) by mouth daily as needed for moderate constipation. 30 tablet PRN   No current facility-administered medications for this visit.    Allergies as of 12/28/2020 - Review Complete 12/28/2020  Allergen Reaction Noted   Ciprofloxacin Rash 08/24/2020    Family History  Problem Relation Age of Onset   Colon cancer Neg Hx    Colon polyps Neg Hx     Social History   Socioeconomic History   Marital status: Married    Spouse name: Not on file   Number of children: 3   Years of education: Not on file   Highest education level: 12th grade  Occupational History   Not on file  Tobacco Use   Smoking status: Never   Smokeless tobacco: Never  Vaping Use   Vaping Use: Never used  Substance and Sexual  Activity   Alcohol use: Never   Drug use: Never   Sexual activity: Yes    Birth control/protection: None  Other Topics Concern   Not on file  Social History Narrative   Not on file   Social Determinants of Health   Financial Resource Strain: Low Risk    Difficulty of Paying Living Expenses: Not hard at all  Food Insecurity: No Food Insecurity   Worried About Charity fundraiser in the Last Year: Never true   [place] in the Last Year: Never true  Transportation Needs: No Transportation Needs   Lack of Transportation (Medical): No   Lack of Transportation (Non-Medical): No  Physical Activity: Insufficiently Active   Days of Exercise per Week: 3 days   Minutes of Exercise per Session: 10 min  Stress: No Stress Concern Present   Feeling of Stress : Not at all  Social Connections: Moderately Integrated   Frequency of Communication with Friends and Family: More than three times a week   Frequency of Social Gatherings with Friends and Family: More than three times a week   Attends Religious Services: 1 to 4 times per year   Active Member of [employer] or Organizations: No   Attends Archivist Meetings: Never   Marital Status: Married  Human resources officer Violence: Not At Risk   Fear of Current or Ex-Partner: No  Emotionally Abused: No   Physically Abused: No   Sexually Abused: No    Review of Systems: Gen: Denies any fever, chills, fatigue, weight loss, lack of appetite.  CV: Denies chest pain, heart palpitations, peripheral edema, syncope.  Resp: Denies shortness of breath at rest or with exertion. Denies wheezing or cough.  GI: see HPI GU : Denies urinary burning, urinary frequency, urinary hesitancy MS: Denies joint pain, muscle weakness, cramps, or limitation of movement.  Derm: Denies rash, itching, dry skin Psych: Denies depression, anxiety, memory loss, and confusion Heme: Denies bruising, bleeding, and enlarged lymph nodes.  Physical Exam: BP (!) 142/83     Pulse 74    Temp (!) 96.8 F (36 C) (Temporal)    Ht 5\' 1"  (1.549 m)    Wt 196 lb (88.9 kg)    BMI 37.03 kg/m  General:   Alert and oriented. Pleasant and cooperative. Well-nourished and well-developed.  Head:  Normocephalic and atraumatic. Eyes:  Without icterus, sclera clear and conjunctiva pink.  Ears:  Normal auditory acuity. Mouth:  mask in place Lungs:  Clear to auscultation bilaterally. No wheezes, rales, or rhonchi. No distress.  Heart:  S1, S2 present without murmurs appreciated.  Abdomen:  +BS, soft, non-tender and non-distended. No HSM noted. No guarding or rebound. No masses appreciated.  Rectal:  patient declined  Msk:  Symmetrical without gross deformities. Normal posture. Extremities:  Without edema. Neurologic:  Alert and  oriented x4;  grossly normal neurologically. Skin:  Intact without significant lesions or rashes. Psych:  Alert and cooperative. Normal mood and affect.  ASSESSMENT: Ann Valdez is a 65 y.o. female presenting today at the request of Darla Lesches, [place], for screening colonoscopy. No prior colonoscopy. No family history of colorectal cancer or polyps. Chronic constipation noted.  Constipation: chronic. responds well to dulcolax. We will add fiber as well daily. Notes vaginal pressure when needing to have a BM. Mild rectocele per GYN. Declined rectal exam.    PLAN: Add Benefiber daily Continue dulcolax Proceed with colonoscopy by Dr. Gala Romney in near future: the risks, benefits, and alternatives have been discussed with the patient in detail. The patient states understanding and desires to proceed.   Annitta Needs, PhD, ANP-BC [facility]

## 2021-01-05 ENCOUNTER — Telehealth: Payer: Self-pay | Admitting: Internal Medicine

## 2021-01-05 NOTE — Telephone Encounter (Signed)
Advised pt she does not have to pay that upfront and day of procedure tell them to bill her and wait to see what her insurance will cover.

## 2021-01-05 NOTE — Telephone Encounter (Signed)
Pt is scheduled with Dr Gala Romney on 12/28. She said that [facility] called her and told her she would have to pay $300+ out of pocket upfront. She called her insurance and was told they would cover 100%. She doesn't know what to do. Please advise. [phone]

## 2021-01-11 ENCOUNTER — Encounter (HOSPITAL_COMMUNITY): Payer: Self-pay | Admitting: Internal Medicine

## 2021-01-11 ENCOUNTER — Other Ambulatory Visit: Payer: Self-pay

## 2021-01-11 ENCOUNTER — Encounter (HOSPITAL_COMMUNITY)
Admission: RE | Disposition: A | Discharge status: Home / Self Care | Payer: Self-pay | Source: Ambulatory Visit | Attending: Internal Medicine

## 2021-01-11 ENCOUNTER — Ambulatory Visit (HOSPITAL_COMMUNITY)
Admission: RE | Admit: 2021-01-11 | Discharge: 2021-01-11 | Disposition: A | Discharge status: Home / Self Care | Payer: Medicare Other | Source: Ambulatory Visit | Attending: Internal Medicine | Admitting: Internal Medicine

## 2021-01-11 DIAGNOSIS — D175 Benign lipomatous neoplasm of intra-abdominal organs: Secondary | ICD-10-CM | POA: Diagnosis not present

## 2021-01-11 DIAGNOSIS — K5909 Other constipation: Secondary | ICD-10-CM | POA: Insufficient documentation

## 2021-01-11 DIAGNOSIS — Z1211 Encounter for screening for malignant neoplasm of colon: Secondary | ICD-10-CM

## 2021-01-11 HISTORY — PX: COLONOSCOPY: SHX5424

## 2021-01-11 SURGERY — COLONOSCOPY
Anesthesia: Moderate Sedation

## 2021-01-11 MED ORDER — MEPERIDINE HCL 50 MG/ML IJ SOLN
INTRAMUSCULAR | Status: AC
  Filled 2021-01-11: qty 1

## 2021-01-11 MED ORDER — ONDANSETRON HCL 4 MG/2ML IJ SOLN
INTRAMUSCULAR | Status: DC | PRN
  Administered 2021-01-11: 4 mg via INTRAVENOUS

## 2021-01-11 MED ORDER — MIDAZOLAM HCL 5 MG/5ML IJ SOLN
INTRAMUSCULAR | Status: DC | PRN
  Administered 2021-01-11: 1 mg via INTRAVENOUS
  Administered 2021-01-11: 2 mg via INTRAVENOUS

## 2021-01-11 MED ORDER — SODIUM CHLORIDE 0.9 % IV SOLN: INTRAVENOUS | Status: DC

## 2021-01-11 MED ORDER — MEPERIDINE HCL 100 MG/ML IJ SOLN
INTRAMUSCULAR | Status: DC | PRN
  Administered 2021-01-11: 10 mg
  Administered 2021-01-11: 25 mg

## 2021-01-11 MED ORDER — ONDANSETRON HCL 4 MG/2ML IJ SOLN
INTRAMUSCULAR | Status: AC
  Filled 2021-01-11: qty 2

## 2021-01-11 MED ORDER — MIDAZOLAM HCL 5 MG/5ML IJ SOLN
INTRAMUSCULAR | Status: AC
  Filled 2021-01-11: qty 10

## 2021-01-11 NOTE — Op Note (Signed)
[facility] Patient Name: Ann Valdez Procedure Date: 01/11/2021 7:20 AM MRN: [id number] Date of Birth: 11/13/1955 Attending MD: Norvel Richards , MD CSN: [id number] Age: 65 Admit Type: Outpatient Procedure:                Colonoscopy Indications:              Screening for colorectal malignant neoplasm Providers:                Norvel Richards, MD, Janeece Riggers, RN, Randa Spike, Technician Referring MD:              Medicines:                Midazolam 3 mg IV, Meperidine 35 mg IV Complications:            No immediate complications. Estimated Blood Loss:     Estimated blood loss: none. Procedure:                Pre-Anesthesia Assessment:                           - Prior to the procedure, a History and Physical                            was performed, and patient medications and                            allergies were reviewed. The patient's tolerance of                            previous anesthesia was also reviewed. The risks                            and benefits of the procedure and the sedation                            options and risks were discussed with the patient.                            All questions were answered, and informed consent                            was obtained. Prior Anticoagulants: The patient has                            taken no previous anticoagulant or antiplatelet                            agents. ASA Grade Assessment: II - A patient with                            mild systemic disease. After reviewing the risks  and benefits, the patient was deemed in                            satisfactory condition to undergo the procedure.                           After obtaining informed consent, the colonoscope                            was passed under direct vision. Throughout the                            procedure, the patient's blood pressure, pulse, and                             oxygen saturations were monitored continuously. The                            [id number]) scope was introduced through the                            anus and advanced to the the cecum, identified by                            appendiceal orifice and ileocecal valve. The                            colonoscopy was performed without difficulty. The                            patient tolerated the procedure well. The quality                            of the bowel preparation was adequate. Scope In: 7:48:15 AM Scope Out: 8:01:18 AM Scope Withdrawal Time: 0 hours 8 minutes 16 seconds  Total Procedure Duration: 0 hours 13 minutes 3 seconds  Findings:      The perianal and digital rectal examinations were normal.      1 cm yellowish submucosal nodule in the cecum. Positive pillow sign.       Otherwise,      the colon (entire examined portion) appeared normal.      The retroflexed view of the distal rectum and anal verge was normal and       showed no anal or rectal abnormalities. 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                           - The distal rectum and anal verge are normal on                            retroflexion view.                           -  No specimens collected. Moderate Sedation:      Moderate (conscious) sedation was personally administered by an       anesthesia professional. The following parameters were monitored: oxygen       saturation, heart rate, blood pressure, respiratory rate, EKG, adequacy       of pulmonary ventilation, and response to care. Recommendation:           - Patient has a contact number available for                            emergencies. The signs and symptoms of potential                            delayed complications were discussed with the                            patient. Return to normal activities tomorrow.                             Written discharge instructions were provided to the                            patient.                           - Resume previous diet.                           - Continue present medications.                           - Repeat colonoscopy in 10 years for screening                            purposes.                           - Return to GI office (date not yet determined). Procedure Code(s):        --- Professional ---                           [phone], Colonoscopy, flexible; diagnostic, including                            collection of specimen(s) by brushing or washing,                            when performed (separate procedure) Diagnosis Code(s):        --- Professional ---                           Z12.11, Encounter for screening for malignant                            neoplasm of colon CPT copyright 2019 [employer] Medical Association. All rights reserved. The codes documented in this report are preliminary and upon  coder review may  be revised to meet current compliance requirements. Cristopher Estimable. Zyair Russi, MD Norvel Richards, MD 01/11/2021 8:19:10 AM This report has been signed electronically. Number of Addenda: 0

## 2021-01-11 NOTE — Discharge Instructions (Signed)
°  Colonoscopy Discharge Instructions  Read the instructions outlined below and refer to this sheet in the next few weeks. These discharge instructions provide you with general information on caring for yourself after you leave the hospital. Your doctor may also give you specific instructions. While your treatment has been planned according to the most current medical practices available, unavoidable complications occasionally occur. If you have any problems or questions after discharge, call Dr. Gala Romney at [phone]. ACTIVITY You may resume your regular activity, but move at a slower pace for the next 24 hours.  Take frequent rest periods for the next 24 hours.  Walking will help get rid of the air and reduce the bloated feeling in your belly (abdomen).  No driving for 24 hours (because of the medicine (anesthesia) used during the test).   Do not sign any important legal documents or operate any machinery for 24 hours (because of the anesthesia used during the test).  NUTRITION Drink plenty of fluids.  You may resume your normal diet as instructed by your doctor.  Begin with a light meal and progress to your normal diet. Heavy or fried foods are harder to digest and may make you feel sick to your stomach (nauseated).  Avoid alcoholic beverages for 24 hours or as instructed.  MEDICATIONS You may resume your normal medications unless your doctor tells you otherwise.  WHAT YOU CAN EXPECT TODAY Some feelings of bloating in the abdomen.  Passage of more gas than usual.  Spotting of blood in your stool or on the toilet paper.  IF YOU HAD POLYPS REMOVED DURING THE COLONOSCOPY: No aspirin products for 7 days or as instructed.  No alcohol for 7 days or as instructed.  Eat a soft diet for the next 24 hours.  FINDING OUT THE RESULTS OF YOUR TEST Not all test results are available during your visit. If your test results are not back during the visit, make an appointment with your caregiver to find out the  results. Do not assume everything is normal if you have not heard from your caregiver or the medical facility. It is important for you to follow up on all of your test results.  SEEK IMMEDIATE MEDICAL ATTENTION IF: You have more than a spotting of blood in your stool.  Your belly is swollen (abdominal distention).  You are nauseated or vomiting.  You have a temperature over 101.  You have abdominal pain or discomfort that is severe or gets worse throughout the day.    Your colonoscopy was normal today  It is recommended you return for 1 more screening colonoscopy in 10 years  At patient request, I called Jaclynn Major at [phone] -reviewed findings and recommendations

## 2021-01-11 NOTE — Interval H&P Note (Signed)
History and Physical Interval Note:  01/11/2021 7:28 AM  Ann Valdez  has presented today for surgery, with the diagnosis of SCREENING.  The various methods of treatment have been discussed with the patient and family. After consideration of risks, benefits and other options for treatment, the patient has consented to  Procedure(s) with comments: COLONOSCOPY (N/A) - 7:30AM as a surgical intervention.  The patient's history has been reviewed, patient examined, no change in status, stable for surgery.  I have reviewed the patient's chart and labs.  Questions were answered to the patient's satisfaction.     Manus Rudd  Patient seen and examined.  Here for first-ever average risk screening colonoscopy.   The risks, benefits, limitations, alternatives and imponderables have been reviewed with the patient. Questions have been answered. All parties are agreeable.

## 2021-01-17 ENCOUNTER — Encounter (HOSPITAL_COMMUNITY): Payer: Self-pay | Admitting: Internal Medicine

## 2021-02-21 ENCOUNTER — Ambulatory Visit (INDEPENDENT_AMBULATORY_CARE_PROVIDER_SITE_OTHER): Payer: Medicare PPO | Admitting: Family Medicine

## 2021-02-21 ENCOUNTER — Encounter: Payer: Self-pay | Admitting: Family Medicine

## 2021-02-21 VITALS — BP 127/73 | HR 77 | Temp 98.2°F | Ht 61.0 in | Wt 196.2 lb

## 2021-02-21 DIAGNOSIS — Z6835 Body mass index (BMI) 35.0-35.9, adult: Secondary | ICD-10-CM | POA: Diagnosis not present

## 2021-02-21 DIAGNOSIS — B079 Viral wart, unspecified: Secondary | ICD-10-CM | POA: Diagnosis not present

## 2021-02-21 DIAGNOSIS — E7800 Pure hypercholesterolemia, unspecified: Secondary | ICD-10-CM

## 2021-02-21 DIAGNOSIS — R030 Elevated blood-pressure reading, without diagnosis of hypertension: Secondary | ICD-10-CM | POA: Diagnosis not present

## 2021-02-21 NOTE — Progress Notes (Signed)
Subjective:  Patient ID: Ann Valdez, female    DOB: Dec 05, 1955, 66 y.o.   MRN: [id number]  Patient Care Team: Baruch Gouty, FNP as PCP - General (Family Medicine)   Chief Complaint:  Medical Management of Chronic Issues   HPI: Ann Valdez is a 66 y.o. female presenting on 02/21/2021 for Medical Management of Chronic Issues   1. BMI 35.0-35.9,adult Has been watching diet and being more active. No specific exercise routine.   2. Elevated blood-pressure reading, without diagnosis of hypertension Has cut back salt intake significantly. Has modified diet. Reports normal BP readings at home. No headaches, chest pain, shortness of breath, leg swelling, or visual changes.   3. Pure hypercholesterolemia Has been taking Red Yeast Rice 2400 mg daily. Diet has been modified and she is more active.   4. Verruca vulgaris Pt states she has a wart on her left index finger, states has been present for several weeks. She has tried several OTC treatments without resolve.      Relevant past medical, surgical, family, and social history reviewed and updated as indicated.  Allergies and medications reviewed and updated. Data reviewed: Chart in Epic.   Past Medical History:  Diagnosis Date   Medical history non-contributory     Past Surgical History:  Procedure Laterality Date   COLONOSCOPY N/A 01/11/2021   Procedure: COLONOSCOPY;  Surgeon: Daneil Dolin, MD;  Location: [facility];  Service: Endoscopy;  Laterality: N/A;  7:30AM   COSMETIC SURGERY      Social History   Socioeconomic History   Marital status: Married    Spouse name: Not on file   Number of children: 3   Years of education: Not on file   Highest education level: 12th grade  Occupational History   Not on file  Tobacco Use   Smoking status: Never   Smokeless tobacco: Never  Vaping Use   Vaping Use: Never used  Substance and Sexual Activity   Alcohol use: Never   Drug use: Never   Sexual  activity: Yes    Birth control/protection: None  Other Topics Concern   Not on file  Social History Narrative   Not on file   Social Determinants of Health   Financial Resource Strain: Low Risk    Difficulty of Paying Living Expenses: Not hard at all  Food Insecurity: No Food Insecurity   Worried About Charity fundraiser in the Last Year: Never true   [place] in the Last Year: Never true  Transportation Needs: No Transportation Needs   Lack of Transportation (Medical): No   Lack of Transportation (Non-Medical): No  Physical Activity: Insufficiently Active   Days of Exercise per Week: 3 days   Minutes of Exercise per Session: 10 min  Stress: No Stress Concern Present   Feeling of Stress : Not at all  Social Connections: Moderately Integrated   Frequency of Communication with Friends and Family: More than three times a week   Frequency of Social Gatherings with Friends and Family: More than three times a week   Attends Religious Services: 1 to 4 times per year   Active Member of [employer] or Organizations: No   Attends Archivist Meetings: Never   Marital Status: Married  Human resources officer Violence: Not At Risk   Fear of Current or Ex-Partner: No   Emotionally Abused: No   Physically Abused: No   Sexually Abused: No    Outpatient Encounter Medications  as of 02/21/2021  Medication Sig   bisacodyl 5 MG EC tablet Take 1 tablet (5 mg total) by mouth daily as needed for moderate constipation. (Patient taking differently: Take 10 mg by mouth daily at 8 pm.)   [DISCONTINUED] polyethylene glycol-electrolytes (NULYTELY) 420 g solution As directed   No facility-administered encounter medications on file as of 02/21/2021.    Allergies  Allergen Reactions   Ciprofloxacin Rash    Review of Systems  Constitutional:  Negative for activity change, appetite change, chills, diaphoresis, fatigue, fever and unexpected weight change.  HENT: Negative.    Eyes: Negative.   Negative for photophobia and visual disturbance.  Respiratory:  Negative for cough, chest tightness and shortness of breath.   Cardiovascular:  Negative for chest pain, palpitations and leg swelling.  Gastrointestinal:  Negative for abdominal pain, blood in stool, constipation, diarrhea, nausea and vomiting.  Endocrine: Negative.   Genitourinary:  Negative for decreased urine volume, difficulty urinating, dysuria, frequency and urgency.  Musculoskeletal:  Negative for arthralgias and myalgias.  Skin: Negative.        lesion  Allergic/Immunologic: Negative.   Neurological:  Negative for dizziness, tremors, seizures, syncope, facial asymmetry, speech difficulty, weakness, light-headedness, numbness and headaches.  Hematological: Negative.   Psychiatric/Behavioral:  Negative for confusion, hallucinations, sleep disturbance and suicidal ideas.   All other systems reviewed and are negative.      Objective:  BP 127/73    Pulse 77    Temp 98.2 F (36.8 C) (Temporal)    Ht '5\' 1"'  (1.549 m)    Wt 196 lb 4 oz (89 kg)    BMI 37.08 kg/m    Wt Readings from Last 3 Encounters:  02/21/21 196 lb 4 oz (89 kg)  01/11/21 185 lb (83.9 kg)  12/28/20 196 lb (88.9 kg)    Physical Exam Vitals and nursing note reviewed.  Constitutional:      General: She is not in acute distress.    Appearance: Normal appearance. She is well-developed and well-groomed. She is obese. She is not ill-appearing, toxic-appearing or diaphoretic.  HENT:     Head: Normocephalic and atraumatic.     Jaw: There is normal jaw occlusion.     Right Ear: Hearing normal.     Left Ear: Hearing normal.     Nose: Nose normal.     Mouth/Throat:     Lips: Pink.     Mouth: Mucous membranes are moist.     Pharynx: Oropharynx is clear. Uvula midline.  Eyes:     General: Lids are normal.     Extraocular Movements: Extraocular movements intact.     Conjunctiva/sclera: Conjunctivae normal.     Pupils: Pupils are equal, round, and reactive  to light.  Neck:     Thyroid: No thyroid mass, thyromegaly or thyroid tenderness.     Vascular: No carotid bruit or JVD.     Trachea: Trachea and phonation normal.  Cardiovascular:     Rate and Rhythm: Normal rate and regular rhythm.     Chest Wall: PMI is not displaced.     Pulses: Normal pulses.     Heart sounds: Normal heart sounds. No murmur heard.   No friction rub. No gallop.  Pulmonary:     Effort: Pulmonary effort is normal. No respiratory distress.     Breath sounds: Normal breath sounds. No wheezing.  Abdominal:     General: Bowel sounds are normal. There is no distension or abdominal bruit.     Palpations: Abdomen  is soft. There is no hepatomegaly or splenomegaly.     Tenderness: There is no abdominal tenderness. There is no right CVA tenderness or left CVA tenderness.     Hernia: No hernia is present.  Musculoskeletal:        General: Normal range of motion.     Cervical back: Normal range of motion and neck supple.     Right lower leg: No edema.     Left lower leg: No edema.  Lymphadenopathy:     Cervical: No cervical adenopathy.  Skin:    General: Skin is warm and dry.     Capillary Refill: Capillary refill takes less than 2 seconds.     Coloration: Skin is not cyanotic, jaundiced or pale.     Findings: Lesion (raised hyperkeratotic papule to left index finger) present. No erythema or rash.       Neurological:     General: No focal deficit present.     Mental Status: She is alert and oriented to person, place, and time.     Sensory: Sensation is intact.     Motor: Motor function is intact.     Coordination: Coordination is intact.     Gait: Gait is intact.     Deep Tendon Reflexes: Reflexes are normal and symmetric.  Psychiatric:        Attention and Perception: Attention and perception normal.        Mood and Affect: Mood and affect normal.        Speech: Speech normal.        Behavior: Behavior normal. Behavior is cooperative.        Thought Content:  Thought content normal.        Cognition and Memory: Cognition and memory normal.        Judgment: Judgment normal.    Results for orders placed or performed in visit on 10/14/20  Cytology - PAP( [place])  Result Value Ref Range   High risk HPV Negative    Adequacy Satisfactory for evaluation.    Diagnosis      - Negative for intraepithelial lesion or malignancy (NILM)   Comment Normal Reference Range HPV - Negative      Verruca vulgaris to left pointer finger: cryotherapy performed with three freeze thaw cycles. Pt tolerated well, bandage applied.   Pertinent labs & imaging results that were available during my care of the patient were reviewed by me and considered in my medical decision making.  Assessment & Plan:  Caya was seen today for medical management of chronic issues.  Diagnoses and all orders for this visit:  BMI 35.0-35.9,adult Diet and exercise encouraged. Will repeat labs today.  -     CMP14+EGFR -     CBC with Differential/Platelet -     Lipid panel -     Thyroid Panel With TSH  Elevated blood-pressure reading, without diagnosis of hypertension Well controlled with dietary changes, continue. Report any persistent high readings at home. Labs pending.  -     CMP14+EGFR -     CBC with Differential/Platelet -     Lipid panel -     Thyroid Panel With TSH  Pure hypercholesterolemia Labs pending. Continue red yeast rice along with diet and exercise.  -     Lipid panel  Verruca vulgaris Cryotherapy performed. Pt tolerated well. Return in 3-4 weeks for repeat therapy. Wound care discussed in detail.     Continue all other maintenance medications.  Follow up plan:  Return in about 3 months (around 05/21/2021), or if symptoms worsen or fail to improve.   Continue healthy lifestyle choices, including diet (rich in fruits, vegetables, and lean proteins, and low in salt and simple carbohydrates) and exercise (at least 30 minutes of moderate physical activity  daily).  Educational handout given for aftercare for cryotherapy   The above assessment and management plan was discussed with the patient. The patient verbalized understanding of and has agreed to the management plan. Patient is aware to call the clinic if they develop any new symptoms or if symptoms persist or worsen. Patient is aware when to return to the clinic for a follow-up visit. Patient educated on when it is appropriate to go to the emergency department.   Monia Pouch, FNP-C [facility] [phone]

## 2021-02-22 LAB — CMP14+EGFR
ALT: 25 IU/L (ref 0–32)
AST: 20 IU/L (ref 0–40)
Albumin/Globulin Ratio: 2 (ref 1.2–2.2)
Albumin: 4.4 g/dL (ref 3.8–4.8)
Alkaline Phosphatase: 69 IU/L (ref 44–121)
BUN/Creatinine Ratio: 22 (ref 12–28)
BUN: 16 mg/dL (ref 8–27)
Bilirubin Total: 0.4 mg/dL (ref 0.0–1.2)
CO2: 23 mmol/L (ref 20–29)
Calcium: 9.3 mg/dL (ref 8.7–10.3)
Chloride: 105 mmol/L (ref 96–106)
Creatinine, Ser: 0.74 mg/dL (ref 0.57–1.00)
Globulin, Total: 2.2 g/dL (ref 1.5–4.5)
Glucose: 113 mg/dL — ABNORMAL HIGH (ref 70–99)
Potassium: 4.4 mmol/L (ref 3.5–5.2)
Sodium: 142 mmol/L (ref 134–144)
Total Protein: 6.6 g/dL (ref 6.0–8.5)
eGFR: 90 mL/min/{1.73_m2} (ref >59)

## 2021-02-22 LAB — CBC WITH DIFFERENTIAL/PLATELET
Basophils Absolute: 0 10*3/uL (ref 0.0–0.2)
Basos: 1 %
EOS (ABSOLUTE): 0.1 10*3/uL (ref 0.0–0.4)
Eos: 2 %
Hematocrit: 44.4 % (ref 34.0–46.6)
Hemoglobin: 14.5 g/dL (ref 11.1–15.9)
Immature Grans (Abs): 0 10*3/uL (ref 0.0–0.1)
Immature Granulocytes: 0 %
Lymphocytes Absolute: 2 10*3/uL (ref 0.7–3.1)
Lymphs: 34 %
MCH: 27.8 pg (ref 26.6–33.0)
MCHC: 32.7 g/dL (ref 31.5–35.7)
MCV: 85 fL (ref 79–97)
Monocytes Absolute: 0.4 10*3/uL (ref 0.1–0.9)
Monocytes: 7 %
Neutrophils Absolute: 3.4 10*3/uL (ref 1.4–7.0)
Neutrophils: 56 %
Platelets: 233 10*3/uL (ref 150–450)
RBC: 5.21 x10E6/uL (ref 3.77–5.28)
RDW: 14.1 % (ref 11.7–15.4)
WBC: 5.8 10*3/uL (ref 3.4–10.8)

## 2021-02-22 LAB — THYROID PANEL WITH TSH
Free Thyroxine Index: 1.4 (ref 1.2–4.9)
T3 Uptake Ratio: 24 % (ref 24–39)
T4, Total: 5.9 ug/dL (ref 4.5–12.0)
TSH: 5 u[IU]/mL — ABNORMAL HIGH (ref 0.450–4.500)

## 2021-02-22 LAB — LIPID PANEL
Chol/HDL Ratio: 3.7 ratio (ref 0.0–4.4)
Cholesterol, Total: 265 mg/dL — ABNORMAL HIGH (ref 100–199)
HDL: 71 mg/dL (ref >39)
LDL Chol Calc (NIH): 178 mg/dL — ABNORMAL HIGH (ref 0–99)
Triglycerides: 95 mg/dL (ref 0–149)
VLDL Cholesterol Cal: 16 mg/dL (ref 5–40)

## 2021-05-25 ENCOUNTER — Ambulatory Visit (INDEPENDENT_AMBULATORY_CARE_PROVIDER_SITE_OTHER): Payer: Medicare PPO | Admitting: Family Medicine

## 2021-05-25 ENCOUNTER — Encounter: Payer: Self-pay | Admitting: Family Medicine

## 2021-05-25 VITALS — BP 121/72 | HR 79 | Temp 98.8°F | Ht 61.0 in | Wt 195.0 lb

## 2021-05-25 DIAGNOSIS — R5381 Other malaise: Secondary | ICD-10-CM | POA: Diagnosis not present

## 2021-05-25 DIAGNOSIS — R5382 Chronic fatigue, unspecified: Secondary | ICD-10-CM

## 2021-05-25 DIAGNOSIS — E7800 Pure hypercholesterolemia, unspecified: Secondary | ICD-10-CM | POA: Diagnosis not present

## 2021-05-25 DIAGNOSIS — B079 Viral wart, unspecified: Secondary | ICD-10-CM | POA: Diagnosis not present

## 2021-05-25 DIAGNOSIS — K5901 Slow transit constipation: Secondary | ICD-10-CM | POA: Diagnosis not present

## 2021-05-25 DIAGNOSIS — Z6835 Body mass index (BMI) 35.0-35.9, adult: Secondary | ICD-10-CM

## 2021-05-25 DIAGNOSIS — E559 Vitamin D deficiency, unspecified: Secondary | ICD-10-CM | POA: Diagnosis not present

## 2021-05-25 NOTE — Progress Notes (Signed)
?  ? ?Subjective:  ?Patient ID: Haide A. Godown, female    DOB: 04-12-1955, 66 y.o.   MRN: [id number] ? ?Patient Care Team: ?Baruch Gouty, FNP as PCP - General (Family Medicine)  ? ?Chief Complaint:  3 month follow up ? ? ?HPI: ?Flor A. Kivett is a 66 y.o. female presenting on 05/25/2021 for 3 month follow up ? ? ?1. BMI 35.0-35.9,adult ?Has been trying to watch caloric intake. Is more active since it is warming up outside.  ? ?2. Slow transit constipation ?Taking stool softener as needed and this works well. Has increased water intake.  ? ?3. Pure hypercholesterolemia ?Has made dietary changes to help lower cholesterol. Is fasting today and would like repeat labs. Currently not on any medications for cholesterol.  ? ?4. Chronic fatigue and malaise ?Ongoing for several months. Feels she is vit D and B12 deficient due to dietary changes. No fever, chills, weakness, night sweats, weight changes, or confusion. No abnormal bleeding or bruising.  ? ?5. Skin lesion ?Has ongoing lesion to left index finger. Had cryotherapy to lesion at last visit, did not return for repeat therapy.  ?  ? ? ?Relevant past medical, surgical, family, and social history reviewed and updated as indicated.  ?Allergies and medications reviewed and updated. Data reviewed: Chart in Epic. ? ? ?Past Medical History:  ?Diagnosis Date  ? Medical history non-contributory   ? ? ?Past Surgical History:  ?Procedure Laterality Date  ? COLONOSCOPY N/A 01/11/2021  ? Procedure: COLONOSCOPY;  Surgeon: Daneil Dolin, MD;  Location: [facility];  Service: Endoscopy;  Laterality: N/A;  7:30AM  ? COSMETIC SURGERY    ? ? ?Social History  ? ?Socioeconomic History  ? Marital status: Married  ?  Spouse name: Not on file  ? Number of children: 3  ? Years of education: Not on file  ? Highest education level: 12th grade  ?Occupational History  ? Not on file  ?Tobacco Use  ? Smoking status: Never  ? Smokeless tobacco: Never  ?Vaping Use  ? Vaping Use: Never used   ?Substance and Sexual Activity  ? Alcohol use: Never  ? Drug use: Never  ? Sexual activity: Yes  ?  Birth control/protection: None  ?Other Topics Concern  ? Not on file  ?Social History Narrative  ? Not on file  ? ?Social Determinants of Health  ? ?Financial Resource Strain: Low Risk   ? Difficulty of Paying Living Expenses: Not hard at all  ?Food Insecurity: No Food Insecurity  ? Worried About Charity fundraiser in the Last Year: Never true  ? Ran Out of Food in the Last Year: Never true  ?Transportation Needs: No Transportation Needs  ? Lack of Transportation (Medical): No  ? Lack of Transportation (Non-Medical): No  ?Physical Activity: Insufficiently Active  ? Days of Exercise per Week: 3 days  ? Minutes of Exercise per Session: 10 min  ?Stress: No Stress Concern Present  ? Feeling of Stress : Not at all  ?Social Connections: Moderately Integrated  ? Frequency of Communication with Friends and Family: More than three times a week  ? Frequency of Social Gatherings with Friends and Family: More than three times a week  ? Attends Religious Services: 1 to 4 times per year  ? Active Member of Clubs or Organizations: No  ? Attends Archivist Meetings: Never  ? Marital Status: Married  ?Intimate Partner Violence: Not At Risk  ? Fear of Current or Ex-Partner: No  ?  Emotionally Abused: No  ? Physically Abused: No  ? Sexually Abused: No  ? ? ?Outpatient Encounter Medications as of 05/25/2021  ?Medication Sig  ? bisacodyl 5 MG EC tablet Take 1 tablet (5 mg total) by mouth daily as needed for moderate constipation. (Patient taking differently: Take 10 mg by mouth daily at 8 pm.)  ? ?No facility-administered encounter medications on file as of 05/25/2021.  ? ? ?Allergies  ?Allergen Reactions  ? Ciprofloxacin Rash  ? ? ?Review of Systems  ?Constitutional:  Positive for fatigue. Negative for activity change, appetite change, chills, diaphoresis, fever and unexpected weight change.  ?Eyes:  Negative for photophobia and  visual disturbance.  ?Respiratory:  Negative for cough and shortness of breath.   ?Cardiovascular:  Negative for chest pain, palpitations and leg swelling.  ?Gastrointestinal:  Positive for constipation. Negative for abdominal pain, diarrhea, nausea and vomiting.  ?Genitourinary:  Negative for decreased urine volume and difficulty urinating.  ?Musculoskeletal:  Negative for arthralgias, back pain, gait problem, joint swelling, myalgias, neck pain and neck stiffness.  ?Skin:   ?     Lesion to left index finger  ?Neurological:  Negative for dizziness, tremors, seizures, syncope, facial asymmetry, speech difficulty, weakness, light-headedness, numbness and headaches.  ?Psychiatric/Behavioral:  Negative for confusion.   ?All other systems reviewed and are negative. ? ?   ? ?Objective:  ?BP 121/72   Pulse 79   Temp 98.8 ?F (37.1 ?C)   Ht '5\' 1"'  (1.549 m)   Wt 195 lb (88.5 kg)   SpO2 94%   BMI 36.84 kg/m?   ? ?Wt Readings from Last 3 Encounters:  ?05/25/21 195 lb (88.5 kg)  ?02/21/21 196 lb 4 oz (89 kg)  ?01/11/21 185 lb (83.9 kg)  ? ? ?Physical Exam ?Vitals and nursing note reviewed.  ?Constitutional:   ?   General: She is not in acute distress. ?   Appearance: Normal appearance. She is obese. She is not ill-appearing, toxic-appearing or diaphoretic.  ?HENT:  ?   Head: Normocephalic and atraumatic.  ?Eyes:  ?   Conjunctiva/sclera: Conjunctivae normal.  ?   Pupils: Pupils are equal, round, and reactive to light.  ?Cardiovascular:  ?   Rate and Rhythm: Normal rate and regular rhythm.  ?   Heart sounds: Normal heart sounds.  ?Pulmonary:  ?   Effort: Pulmonary effort is normal.  ?   Breath sounds: Normal breath sounds.  ?Musculoskeletal:  ?   Cervical back: Normal range of motion and neck supple.  ?Skin: ?   General: Skin is warm and dry.  ?   Capillary Refill: Capillary refill takes less than 2 seconds.  ?   Findings: Lesion (raised hyperkeratotic papule to left index finger) present.  ?Neurological:  ?   General: No  focal deficit present.  ?   Mental Status: She is alert and oriented to person, place, and time.  ?Psychiatric:     ?   Mood and Affect: Mood normal.     ?   Behavior: Behavior normal.     ?   Thought Content: Thought content normal.     ?   Judgment: Judgment normal.  ? ? ?Results for orders placed or performed in visit on 02/21/21  ?CMP14+EGFR  ?Result Value Ref Range  ? Glucose 113 (H) 70 - 99 mg/dL  ? BUN 16 8 - 27 mg/dL  ? Creatinine, Ser 0.74 0.57 - 1.00 mg/dL  ? eGFR 90 >59 mL/min/1.73  ? BUN/Creatinine Ratio 22 12 -  28  ? Sodium 142 134 - 144 mmol/L  ? Potassium 4.4 3.5 - 5.2 mmol/L  ? Chloride 105 96 - 106 mmol/L  ? CO2 23 20 - 29 mmol/L  ? Calcium 9.3 8.7 - 10.3 mg/dL  ? Total Protein 6.6 6.0 - 8.5 g/dL  ? Albumin 4.4 3.8 - 4.8 g/dL  ? Globulin, Total 2.2 1.5 - 4.5 g/dL  ? Albumin/Globulin Ratio 2.0 1.2 - 2.2  ? Bilirubin Total 0.4 0.0 - 1.2 mg/dL  ? Alkaline Phosphatase 69 44 - 121 IU/L  ? AST 20 0 - 40 IU/L  ? ALT 25 0 - 32 IU/L  ?CBC with Differential/Platelet  ?Result Value Ref Range  ? WBC 5.8 3.4 - 10.8 x10E3/uL  ? RBC 5.21 3.77 - 5.28 x10E6/uL  ? Hemoglobin 14.5 11.1 - 15.9 g/dL  ? Hematocrit 44.4 34.0 - 46.6 %  ? MCV 85 79 - 97 fL  ? MCH 27.8 26.6 - 33.0 pg  ? MCHC 32.7 31.5 - 35.7 g/dL  ? RDW 14.1 11.7 - 15.4 %  ? Platelets 233 150 - 450 x10E3/uL  ? Neutrophils 56 Not Estab. %  ? Lymphs 34 Not Estab. %  ? Monocytes 7 Not Estab. %  ? Eos 2 Not Estab. %  ? Basos 1 Not Estab. %  ? Neutrophils Absolute 3.4 1.4 - 7.0 x10E3/uL  ? Lymphocytes Absolute 2.0 0.7 - 3.1 x10E3/uL  ? Monocytes Absolute 0.4 0.1 - 0.9 x10E3/uL  ? EOS (ABSOLUTE) 0.1 0.0 - 0.4 x10E3/uL  ? Basophils Absolute 0.0 0.0 - 0.2 x10E3/uL  ? Immature Granulocytes 0 Not Estab. %  ? Immature Grans (Abs) 0.0 0.0 - 0.1 x10E3/uL  ?Lipid panel  ?Result Value Ref Range  ? Cholesterol, Total 265 (H) 100 - 199 mg/dL  ? Triglycerides 95 0 - 149 mg/dL  ? HDL 71 >39 mg/dL  ? VLDL Cholesterol Cal 16 5 - 40 mg/dL  ? LDL Chol Calc (NIH) 178 (H) 0 - 99  mg/dL  ? Chol/HDL Ratio 3.7 0.0 - 4.4 ratio  ?Thyroid Panel With TSH  ?Result Value Ref Range  ? TSH 5.000 (H) 0.450 - 4.500 uIU/mL  ? T4, Total 5.9 4.5 - 12.0 ug/dL  ? T3 Uptake Ratio 24 24 - 39 %  ? Free Th

## 2021-05-26 ENCOUNTER — Encounter: Payer: Self-pay | Admitting: Emergency Medicine

## 2021-05-26 LAB — CMP14+EGFR
ALT: 21 IU/L (ref 0–32)
AST: 15 IU/L (ref 0–40)
Albumin/Globulin Ratio: 2.1 (ref 1.2–2.2)
Albumin: 4.4 g/dL (ref 3.8–4.8)
Alkaline Phosphatase: 76 IU/L (ref 44–121)
BUN/Creatinine Ratio: 32 — ABNORMAL HIGH (ref 12–28)
BUN: 24 mg/dL (ref 8–27)
Bilirubin Total: 0.3 mg/dL (ref 0.0–1.2)
CO2: 20 mmol/L (ref 20–29)
Calcium: 9.4 mg/dL (ref 8.7–10.3)
Chloride: 107 mmol/L — ABNORMAL HIGH (ref 96–106)
Creatinine, Ser: 0.74 mg/dL (ref 0.57–1.00)
Globulin, Total: 2.1 g/dL (ref 1.5–4.5)
Glucose: 112 mg/dL — ABNORMAL HIGH (ref 70–99)
Potassium: 4.5 mmol/L (ref 3.5–5.2)
Sodium: 142 mmol/L (ref 134–144)
Total Protein: 6.5 g/dL (ref 6.0–8.5)
eGFR: 90 mL/min/{1.73_m2} (ref >59)

## 2021-05-26 LAB — CBC WITH DIFFERENTIAL/PLATELET
Basophils Absolute: 0 10*3/uL (ref 0.0–0.2)
Basos: 1 %
EOS (ABSOLUTE): 0.1 10*3/uL (ref 0.0–0.4)
Eos: 2 %
Hematocrit: 44.5 % (ref 34.0–46.6)
Hemoglobin: 14.6 g/dL (ref 11.1–15.9)
Immature Grans (Abs): 0 10*3/uL (ref 0.0–0.1)
Immature Granulocytes: 0 %
Lymphocytes Absolute: 1.9 10*3/uL (ref 0.7–3.1)
Lymphs: 35 %
MCH: 27.9 pg (ref 26.6–33.0)
MCHC: 32.8 g/dL (ref 31.5–35.7)
MCV: 85 fL (ref 79–97)
Monocytes Absolute: 0.4 10*3/uL (ref 0.1–0.9)
Monocytes: 7 %
Neutrophils Absolute: 3.1 10*3/uL (ref 1.4–7.0)
Neutrophils: 55 %
Platelets: 240 10*3/uL (ref 150–450)
RBC: 5.24 x10E6/uL (ref 3.77–5.28)
RDW: 13.7 % (ref 11.7–15.4)
WBC: 5.5 10*3/uL (ref 3.4–10.8)

## 2021-05-26 LAB — LIPID PANEL
Chol/HDL Ratio: 3.5 ratio (ref 0.0–4.4)
Cholesterol, Total: 254 mg/dL — ABNORMAL HIGH (ref 100–199)
HDL: 72 mg/dL (ref >39)
LDL Chol Calc (NIH): 170 mg/dL — ABNORMAL HIGH (ref 0–99)
Triglycerides: 72 mg/dL (ref 0–149)
VLDL Cholesterol Cal: 12 mg/dL (ref 5–40)

## 2021-05-26 LAB — VITAMIN B12: Vitamin B-12: 401 pg/mL (ref 232–1245)

## 2021-05-26 LAB — THYROID PANEL WITH TSH
Free Thyroxine Index: 1.4 (ref 1.2–4.9)
T3 Uptake Ratio: 24 % (ref 24–39)
T4, Total: 5.7 ug/dL (ref 4.5–12.0)
TSH: 3.63 u[IU]/mL (ref 0.450–4.500)

## 2021-05-26 LAB — VITAMIN D 25 HYDROXY (VIT D DEFICIENCY, FRACTURES): Vit D, 25-Hydroxy: 19.7 ng/mL — ABNORMAL LOW (ref 30.0–100.0)

## 2021-05-26 MED ORDER — ATORVASTATIN CALCIUM 20 MG PO TABS: 20.0000 mg | ORAL_TABLET | Freq: Every day | ORAL | 3 refills | Status: DC

## 2021-05-26 NOTE — Addendum Note (Signed)
Addended by: Baruch Gouty on: 05/26/2021 01:02 PM ? ? Modules accepted: Orders ? ?

## 2021-11-29 ENCOUNTER — Encounter: Payer: Self-pay | Admitting: Family Medicine

## 2021-11-29 ENCOUNTER — Ambulatory Visit (INDEPENDENT_AMBULATORY_CARE_PROVIDER_SITE_OTHER): Payer: Medicare PPO | Admitting: Family Medicine

## 2021-11-29 VITALS — BP 144/76 | HR 77 | Temp 97.6°F | Ht 61.0 in | Wt 199.2 lb

## 2021-11-29 DIAGNOSIS — E559 Vitamin D deficiency, unspecified: Secondary | ICD-10-CM

## 2021-11-29 DIAGNOSIS — B079 Viral wart, unspecified: Secondary | ICD-10-CM

## 2021-11-29 DIAGNOSIS — Z6835 Body mass index (BMI) 35.0-35.9, adult: Secondary | ICD-10-CM | POA: Diagnosis not present

## 2021-11-29 DIAGNOSIS — E7800 Pure hypercholesterolemia, unspecified: Secondary | ICD-10-CM | POA: Diagnosis not present

## 2021-11-29 NOTE — Patient Instructions (Signed)
[employer]

## 2021-11-29 NOTE — Progress Notes (Signed)
Subjective:  Patient ID: Ann Valdez, female    DOB: 1955-11-20, 66 y.o.   MRN: [id number]  Patient Care Team: Baruch Gouty, FNP as PCP - General (Family Medicine)   Chief Complaint:  Medical Management of Chronic Issues (Month follow up )   HPI: Ann Valdez is a 66 y.o. female presenting on 11/29/2021 for Medical Management of Chronic Issues (Month follow up )   1. Pure hypercholesterolemia Has not been taking statin therapy over the last 2 months. Does try to follow a healthy diet. Does not exercise on a regular basis. When taking the statin therapy she denied myalgias.   2. Vitamin D deficiency Has been taking over the counter repletion therapy and tolerating well. Denies arthralgias, difficulty walking, or recent fractures.   3. BMI 35.0-35.9,adult Does try to follow a healthy diet. Does not exercise on a regular basis.      Relevant past medical, surgical, family, and social history reviewed and updated as indicated.  Allergies and medications reviewed and updated. Data reviewed: Chart in Epic.   Past Medical History:  Diagnosis Date   Medical history non-contributory     Past Surgical History:  Procedure Laterality Date   COLONOSCOPY N/A 01/11/2021   Procedure: COLONOSCOPY;  Surgeon: Daneil Dolin, MD;  Location: [facility];  Service: Endoscopy;  Laterality: N/A;  7:30AM   COSMETIC SURGERY      Social History   Socioeconomic History   Marital status: Married    Spouse name: Not on file   Number of children: 3   Years of education: Not on file   Highest education level: 12th grade  Occupational History   Not on file  Tobacco Use   Smoking status: Never   Smokeless tobacco: Never  Vaping Use   Vaping Use: Never used  Substance and Sexual Activity   Alcohol use: Never   Drug use: Never   Sexual activity: Yes    Birth control/protection: None  Other Topics Concern   Not on file  Social History Narrative   Not on file    Social Determinants of Health   Financial Resource Strain: Low Risk  (12/13/2020)   Overall Financial Resource Strain (CARDIA)    Difficulty of Paying Living Expenses: Not hard at all  Food Insecurity: No Food Insecurity (12/13/2020)   Hunger Vital Sign    Worried About Running Out of Food in the Last Year: Never true    [place] in the Last Year: Never true  Transportation Needs: No Transportation Needs (12/13/2020)   PRAPARE - Hydrologist (Medical): No    Lack of Transportation (Non-Medical): No  Physical Activity: Insufficiently Active (12/13/2020)   Exercise Vital Sign    Days of Exercise per Week: 3 days    Minutes of Exercise per Session: 10 min  Stress: No Stress Concern Present (12/13/2020)   [place]    Feeling of Stress : Not at all  Social Connections: Moderately Integrated (12/13/2020)   Social Connection and Isolation Panel [NHANES]    Frequency of Communication with Friends and Family: More than three times a week    Frequency of Social Gatherings with Friends and Family: More than three times a week    Attends Religious Services: 1 to 4 times per year    Active Member of [employer] or Organizations: No    Attends Archivist Meetings: Never  Marital Status: Married  Human resources officer Violence: Not At Risk (10/14/2020)   Humiliation, Afraid, Rape, and Kick questionnaire    Fear of Current or Ex-Partner: No    Emotionally Abused: No    Physically Abused: No    Sexually Abused: No    Outpatient Encounter Medications as of 11/29/2021  Medication Sig   atorvastatin (LIPITOR) 20 MG tablet Take 1 tablet (20 mg total) by mouth daily.   bisacodyl 5 MG EC tablet Take 1 tablet (5 mg total) by mouth daily as needed for moderate constipation. (Patient taking differently: Take 10 mg by mouth daily at 8 pm.)   No facility-administered encounter medications on file  as of 11/29/2021.    Allergies  Allergen Reactions   Ciprofloxacin Rash    Review of Systems  Constitutional:  Negative for activity change, appetite change, chills, diaphoresis, fatigue, fever and unexpected weight change.  HENT: Negative.    Eyes: Negative.  Negative for photophobia and visual disturbance.  Respiratory:  Negative for cough, chest tightness and shortness of breath.   Cardiovascular:  Negative for chest pain, palpitations and leg swelling.  Gastrointestinal:  Negative for blood in stool, constipation, diarrhea, nausea and vomiting.  Endocrine: Negative.   Genitourinary:  Negative for decreased urine volume, difficulty urinating, dysuria, frequency and urgency.  Musculoskeletal:  Negative for arthralgias and myalgias.  Skin:        Lesion on finger  Allergic/Immunologic: Negative.   Neurological:  Negative for dizziness, tremors, seizures, syncope, facial asymmetry, speech difficulty, weakness, light-headedness, numbness and headaches.  Hematological: Negative.   Psychiatric/Behavioral:  Negative for agitation, confusion, hallucinations, sleep disturbance and suicidal ideas.   All other systems reviewed and are negative.       Objective:  BP (!) 144/76   Pulse 77   Temp 97.6 F (36.4 C) (Temporal)   Ht _0  (1.549 m)   Wt 199 lb 3.2 oz (90.4 kg)   SpO2 95%   BMI 37.64 kg/m    Wt Readings from Last 3 Encounters:  11/29/21 199 lb 3.2 oz (90.4 kg)  05/25/21 195 lb (88.5 kg)  02/21/21 196 lb 4 oz (89 kg)    Physical Exam Vitals and nursing note reviewed.  Constitutional:      General: She is not in acute distress.    Appearance: Normal appearance. She is well-developed and well-groomed. She is obese. She is not ill-appearing, toxic-appearing or diaphoretic.  HENT:     Head: Normocephalic and atraumatic.     Jaw: There is normal jaw occlusion.     Right Ear: Hearing normal.     Left Ear: Hearing normal.     Nose: Nose normal.     Mouth/Throat:      Lips: Pink.     Mouth: Mucous membranes are moist.     Pharynx: Oropharynx is clear. Uvula midline.  Eyes:     General: Lids are normal.     Extraocular Movements: Extraocular movements intact.     Conjunctiva/sclera: Conjunctivae normal.     Pupils: Pupils are equal, round, and reactive to light.  Neck:     Thyroid: No thyroid mass, thyromegaly or thyroid tenderness.     Vascular: No carotid bruit or JVD.     Trachea: Trachea and phonation normal.  Cardiovascular:     Rate and Rhythm: Normal rate and regular rhythm.     Chest Wall: PMI is not displaced.     Pulses: Normal pulses.     Heart sounds: Normal heart  sounds. No murmur heard.    No friction rub. No gallop.  Pulmonary:     Effort: Pulmonary effort is normal. No respiratory distress.     Breath sounds: Normal breath sounds. No wheezing.  Abdominal:     General: Bowel sounds are normal. There is no distension or abdominal bruit.     Palpations: Abdomen is soft. There is no hepatomegaly or splenomegaly.     Tenderness: There is no abdominal tenderness. There is no right CVA tenderness or left CVA tenderness.     Hernia: No hernia is present.  Musculoskeletal:        General: Normal range of motion.     Cervical back: Normal range of motion and neck supple.     Right lower leg: No edema.     Left lower leg: No edema.  Lymphadenopathy:     Cervical: No cervical adenopathy.  Skin:    General: Skin is warm and dry.     Capillary Refill: Capillary refill takes less than 2 seconds.     Coloration: Skin is not cyanotic, jaundiced or pale.     Findings: Lesion present. No rash.     Comments: Lesion to left index finger. Previously treated with 2 rounds of cryotherapy.  Neurological:     General: No focal deficit present.     Mental Status: She is alert and oriented to person, place, and time.     Sensory: Sensation is intact.     Motor: Motor function is intact.     Coordination: Coordination is intact.     Gait: Gait is  intact.     Deep Tendon Reflexes: Reflexes are normal and symmetric.  Psychiatric:        Attention and Perception: Attention and perception normal.        Mood and Affect: Mood and affect normal.        Speech: Speech normal.        Behavior: Behavior normal. Behavior is cooperative.        Thought Content: Thought content normal.        Cognition and Memory: Cognition and memory normal.        Judgment: Judgment normal.     Results for orders placed or performed in visit on 05/25/21  CMP14+EGFR  Result Value Ref Range   Glucose 112 (H) 70 - 99 mg/dL   BUN 24 8 - 27 mg/dL   Creatinine, Ser 0.74 0.57 - 1.00 mg/dL   eGFR 90 >59 mL/min/1.73   BUN/Creatinine Ratio 32 (H) 12 - 28   Sodium 142 134 - 144 mmol/L   Potassium 4.5 3.5 - 5.2 mmol/L   Chloride 107 (H) 96 - 106 mmol/L   CO2 20 20 - 29 mmol/L   Calcium 9.4 8.7 - 10.3 mg/dL   Total Protein 6.5 6.0 - 8.5 g/dL   Albumin 4.4 3.8 - 4.8 g/dL   Globulin, Total 2.1 1.5 - 4.5 g/dL   Albumin/Globulin Ratio 2.1 1.2 - 2.2   Bilirubin Total 0.3 0.0 - 1.2 mg/dL   Alkaline Phosphatase 76 44 - 121 IU/L   AST 15 0 - 40 IU/L   ALT 21 0 - 32 IU/L  CBC with Differential/Platelet  Result Value Ref Range   WBC 5.5 3.4 - 10.8 x10E3/uL   RBC 5.24 3.77 - 5.28 x10E6/uL   Hemoglobin 14.6 11.1 - 15.9 g/dL   Hematocrit 44.5 34.0 - 46.6 %   MCV 85 79 - 97 fL   MCH  27.9 26.6 - 33.0 pg   MCHC 32.8 31.5 - 35.7 g/dL   RDW 13.7 11.7 - 15.4 %   Platelets 240 150 - 450 x10E3/uL   Neutrophils 55 Not Estab. %   Lymphs 35 Not Estab. %   Monocytes 7 Not Estab. %   Eos 2 Not Estab. %   Basos 1 Not Estab. %   Neutrophils Absolute 3.1 1.4 - 7.0 x10E3/uL   Lymphocytes Absolute 1.9 0.7 - 3.1 x10E3/uL   Monocytes Absolute 0.4 0.1 - 0.9 x10E3/uL   EOS (ABSOLUTE) 0.1 0.0 - 0.4 x10E3/uL   Basophils Absolute 0.0 0.0 - 0.2 x10E3/uL   Immature Granulocytes 0 Not Estab. %   Immature Grans (Abs) 0.0 0.0 - 0.1 x10E3/uL  Lipid panel  Result Value Ref Range    Cholesterol, Total 254 (H) 100 - 199 mg/dL   Triglycerides 72 0 - 149 mg/dL   HDL 72 >39 mg/dL   VLDL Cholesterol Cal 12 5 - 40 mg/dL   LDL Chol Calc (NIH) 170 (H) 0 - 99 mg/dL   Chol/HDL Ratio 3.5 0.0 - 4.4 ratio  VITAMIN D 25 Hydroxy (Vit-D Deficiency, Fractures)  Result Value Ref Range   Vit D, 25-Hydroxy 19.7 (L) 30.0 - 100.0 ng/mL  Vitamin B12  Result Value Ref Range   Vitamin B-12 401 232 - 1,245 pg/mL  Thyroid Panel With TSH  Result Value Ref Range   TSH 3.630 0.450 - 4.500 uIU/mL   T4, Total 5.7 4.5 - 12.0 ug/dL   T3 Uptake Ratio 24 24 - 39 %   Free Thyroxine Index 1.4 1.2 - 4.9     Skin excision  Date/Time: 11/29/2021 1:39 PM  Performed by: Baruch Gouty, FNP Authorized by: Baruch Gouty, FNP   Number of Lesions: 1 Lesion 1:    Body area: upper extremity   Upper extremity location: L index finger   Initial size (mm): 1.5   Final defect size (mm): 1.5   Malignancy: malignancy unknown     Destruction method: cryotherapy      Pertinent labs & imaging results that were available during my care of the patient were reviewed by me and considered in my medical decision making.  Assessment & Plan:  Dailin was seen today for medical management of chronic issues.  Diagnoses and all orders for this visit:  Pure hypercholesterolemia Diet encouraged - increase intake of fresh fruits and vegetables, increase intake of lean proteins. Bake, broil, or grill foods. Avoid fried, greasy, and fatty foods. Avoid fast foods. Increase intake of fiber-rich whole grains. Exercise encouraged - at least 150 minutes per week and advance as tolerated. Goal BMI < 25. Continue medications as prescribed. Follow up in 3-6 months as discussed.  -     CMP14+EGFR -     Lipid panel  Vitamin D deficiency Labs pending. Continue repletion therapy. If indicated, will change repletion dosage. Eat foods rich in Vit D including milk, orange juice, yogurt with vitamin D added, salmon or mackerel, canned  tuna fish, cereals with vitamin D added, and cod liver oil. Get out in the sun but make sure to wear at least SPF 30 sunscreen.  -     VITAMIN D 25 Hydroxy (Vit-D Deficiency, Fractures)  BMI 35.0-35.9,adult Diet and exercise encouraged. Labs pending.  -     CBC with Differential/Platelet -     CMP14+EGFR -     Lipid panel -     Thyroid Panel With TSH  Verruca vulgaris  Cryo in office today. Pt aware she has to return for repeat treatment for it to be successful.  -     Skin excision     Continue all other maintenance medications.  Follow up plan: Return in about 4 weeks (around 12/27/2021), or if symptoms worsen or fail to improve, for cryo.   Continue healthy lifestyle choices, including diet (rich in fruits, vegetables, and lean proteins, and low in salt and simple carbohydrates) and exercise (at least 30 minutes of moderate physical activity daily).  Educational handout given for health maintenance  The above assessment and management plan was discussed with the patient. The patient verbalized understanding of and has agreed to the management plan. Patient is aware to call the clinic if they develop any new symptoms or if symptoms persist or worsen. Patient is aware when to return to the clinic for a follow-up visit. Patient educated on when it is appropriate to go to the emergency department.   Monia Pouch, FNP-C [facility] [phone]

## 2021-11-30 LAB — CBC WITH DIFFERENTIAL/PLATELET
Basophils Absolute: 0 10*3/uL (ref 0.0–0.2)
Basos: 1 %
EOS (ABSOLUTE): 0.1 10*3/uL (ref 0.0–0.4)
Eos: 2 %
Hematocrit: 44.3 % (ref 34.0–46.6)
Hemoglobin: 14.5 g/dL (ref 11.1–15.9)
Immature Grans (Abs): 0 10*3/uL (ref 0.0–0.1)
Immature Granulocytes: 0 %
Lymphocytes Absolute: 2 10*3/uL (ref 0.7–3.1)
Lymphs: 32 %
MCH: 27.8 pg (ref 26.6–33.0)
MCHC: 32.7 g/dL (ref 31.5–35.7)
MCV: 85 fL (ref 79–97)
Monocytes Absolute: 0.4 10*3/uL (ref 0.1–0.9)
Monocytes: 7 %
Neutrophils Absolute: 3.7 10*3/uL (ref 1.4–7.0)
Neutrophils: 58 %
Platelets: 234 10*3/uL (ref 150–450)
RBC: 5.22 x10E6/uL (ref 3.77–5.28)
RDW: 13.7 % (ref 11.7–15.4)
WBC: 6.3 10*3/uL (ref 3.4–10.8)

## 2021-11-30 LAB — VITAMIN D 25 HYDROXY (VIT D DEFICIENCY, FRACTURES): Vit D, 25-Hydroxy: 50.3 ng/mL (ref 30.0–100.0)

## 2021-11-30 LAB — CMP14+EGFR
ALT: 27 IU/L (ref 0–32)
AST: 23 IU/L (ref 0–40)
Albumin/Globulin Ratio: 2 (ref 1.2–2.2)
Albumin: 4.4 g/dL (ref 3.9–4.9)
Alkaline Phosphatase: 64 IU/L (ref 44–121)
BUN/Creatinine Ratio: 21 (ref 12–28)
BUN: 17 mg/dL (ref 8–27)
Bilirubin Total: 0.3 mg/dL (ref 0.0–1.2)
CO2: 19 mmol/L — ABNORMAL LOW (ref 20–29)
Calcium: 9.5 mg/dL (ref 8.7–10.3)
Chloride: 104 mmol/L (ref 96–106)
Creatinine, Ser: 0.81 mg/dL (ref 0.57–1.00)
Globulin, Total: 2.2 g/dL (ref 1.5–4.5)
Glucose: 104 mg/dL — ABNORMAL HIGH (ref 70–99)
Potassium: 4.3 mmol/L (ref 3.5–5.2)
Sodium: 142 mmol/L (ref 134–144)
Total Protein: 6.6 g/dL (ref 6.0–8.5)
eGFR: 80 mL/min/{1.73_m2} (ref >59)

## 2021-11-30 LAB — LIPID PANEL
Chol/HDL Ratio: 3.1 ratio (ref 0.0–4.4)
Cholesterol, Total: 229 mg/dL — ABNORMAL HIGH (ref 100–199)
HDL: 73 mg/dL (ref >39)
LDL Chol Calc (NIH): 141 mg/dL — ABNORMAL HIGH (ref 0–99)
Triglycerides: 86 mg/dL (ref 0–149)
VLDL Cholesterol Cal: 15 mg/dL (ref 5–40)

## 2021-11-30 LAB — THYROID PANEL WITH TSH
Free Thyroxine Index: 1.6 (ref 1.2–4.9)
T3 Uptake Ratio: 25 % (ref 24–39)
T4, Total: 6.4 ug/dL (ref 4.5–12.0)
TSH: 4.99 u[IU]/mL — ABNORMAL HIGH (ref 0.450–4.500)

## 2021-12-18 ENCOUNTER — Ambulatory Visit (INDEPENDENT_AMBULATORY_CARE_PROVIDER_SITE_OTHER): Payer: Medicare PPO

## 2021-12-18 VITALS — Ht 61.0 in | Wt 199.0 lb

## 2021-12-18 DIAGNOSIS — Z0000 Encounter for general adult medical examination without abnormal findings: Secondary | ICD-10-CM

## 2021-12-18 NOTE — Patient Instructions (Signed)
Ms. Ann Valdez , Thank you for taking time to come for your Medicare Wellness Visit. I appreciate your ongoing commitment to your health goals. Please review the following plan we discussed and let me know if I can assist you in the future.   These are the goals we discussed:  Goals      Exercise 3x per week (30 min per time)     Weight (lb) < 175 lb (79.4 kg)        This is a list of the screening recommended for you and due dates:  Health Maintenance  Topic Date Due   DTaP/Tdap/Td vaccine (1 - Tdap) Never done   Pneumonia Vaccine (1 - PCV) Never done   COVID-19 Vaccine (3 - 2023-24 season) 09/15/2021   Zoster (Shingles) Vaccine (1 of 2) 03/01/2022*   Flu Shot  04/15/2022*   Mammogram  11/30/2022*   Hepatitis C Screening: USPSTF Recommendation to screen - Ages 18-79 yo.  11/30/2022*   Medicare Annual Wellness Visit  12/19/2022   Colon Cancer Screening  01/12/2031   DEXA scan (bone density measurement)  Completed   HPV Vaccine  Aged Out  *Topic was postponed. The date shown is not the original due date.    Advanced directives: Advance directive discussed with you today. I have provided a copy for you to complete at home and have notarized. Once this is complete please bring a copy in to our office so we can scan it into your chart.   Conditions/risks identified: Aim for 30 minutes of exercise or brisk walking, 6-8 glasses of water, and 5 servings of fruits and vegetables each day.   Next appointment: Follow up in one year for your annual wellness visit    Preventive Care 65 Years and Older, Female Preventive care refers to lifestyle choices and visits with your health care provider that can promote health and wellness. What does preventive care include? A yearly physical exam. This is also called an annual well check. Dental exams once or twice a year. Routine eye exams. Ask your health care provider how often you should have your eyes checked. Personal lifestyle choices,  including: Daily care of your teeth and gums. Regular physical activity. Eating a healthy diet. Avoiding tobacco and drug use. Limiting alcohol use. Practicing safe sex. Taking low-dose aspirin every day. Taking vitamin and mineral supplements as recommended by your health care provider. What happens during an annual well check? The services and screenings done by your health care provider during your annual well check will depend on your age, overall health, lifestyle risk factors, and family history of disease. Counseling  Your health care provider may ask you questions about your: Alcohol use. Tobacco use. Drug use. Emotional well-being. Home and relationship well-being. Sexual activity. Eating habits. History of falls. Memory and ability to understand (cognition). Work and work Statistician. Reproductive health. Screening  You may have the following tests or measurements: Height, weight, and BMI. Blood pressure. Lipid and cholesterol levels. These may be checked every 5 years, or more frequently if you are over 22 years old. Skin check. Lung cancer screening. You may have this screening every year starting at age 55 if you have a 30-pack-year history of smoking and currently smoke or have quit within the past 15 years. Fecal occult blood test (FOBT) of the stool. You may have this test every year starting at age 109. Flexible sigmoidoscopy or colonoscopy. You may have a sigmoidoscopy every 5 years or a colonoscopy every 10 years  starting at age 66. Hepatitis C blood test. Hepatitis B blood test. Sexually transmitted disease (STD) testing. Diabetes screening. This is done by checking your blood sugar (glucose) after you have not eaten for a while (fasting). You may have this done every 1-3 years. Bone density scan. This is done to screen for osteoporosis. You may have this done starting at age 55. Mammogram. This may be done every 1-2 years. Talk to your health care provider  about how often you should have regular mammograms. Talk with your health care provider about your test results, treatment options, and if necessary, the need for more tests. Vaccines  Your health care provider may recommend certain vaccines, such as: Influenza vaccine. This is recommended every year. Tetanus, diphtheria, and acellular pertussis (Tdap, Td) vaccine. You may need a Td booster every 10 years. Zoster vaccine. You may need this after age 80. Pneumococcal 13-valent conjugate (PCV13) vaccine. One dose is recommended after age 69. Pneumococcal polysaccharide (PPSV23) vaccine. One dose is recommended after age 55. Talk to your health care provider about which screenings and vaccines you need and how often you need them. This information is not intended to replace advice given to you by your health care provider. Make sure you discuss any questions you have with your health care provider. Document Released: 01/28/2015 Document Revised: 09/21/2015 Document Reviewed: 11/02/2014 Elsevier Interactive Patient Education  2017 [place] Prevention in the Home Falls can cause injuries. They can happen to people of all ages. There are many things you can do to make your home safe and to help prevent falls. What can I do on the outside of my home? Regularly fix the edges of walkways and driveways and fix any cracks. Remove anything that might make you trip as you walk through a door, such as a raised step or threshold. Trim any bushes or trees on the path to your home. Use bright outdoor lighting. Clear any walking paths of anything that might make someone trip, such as rocks or tools. Regularly check to see if handrails are loose or broken. Make sure that both sides of any steps have handrails. Any raised decks and porches should have guardrails on the edges. Have any leaves, snow, or ice cleared regularly. Use sand or salt on walking paths during winter. Clean up any spills in  your garage right away. This includes oil or grease spills. What can I do in the bathroom? Use night lights. Install grab bars by the toilet and in the tub and shower. Do not use towel bars as grab bars. Use non-skid mats or decals in the tub or shower. If you need to sit down in the shower, use a plastic, non-slip stool. Keep the floor dry. Clean up any water that spills on the floor as soon as it happens. Remove soap buildup in the tub or shower regularly. Attach bath mats securely with double-sided non-slip rug tape. Do not have throw rugs and other things on the floor that can make you trip. What can I do in the bedroom? Use night lights. Make sure that you have a light by your bed that is easy to reach. Do not use any sheets or blankets that are too big for your bed. They should not hang down onto the floor. Have a firm chair that has side arms. You can use this for support while you get dressed. Do not have throw rugs and other things on the floor that can make you trip. What  can I do in the kitchen? Clean up any spills right away. Avoid walking on wet floors. Keep items that you use a lot in easy-to-reach places. If you need to reach something above you, use a strong step stool that has a grab bar. Keep electrical cords out of the way. Do not use floor polish or wax that makes floors slippery. If you must use wax, use non-skid floor wax. Do not have throw rugs and other things on the floor that can make you trip. What can I do with my stairs? Do not leave any items on the stairs. Make sure that there are handrails on both sides of the stairs and use them. Fix handrails that are broken or loose. Make sure that handrails are as long as the stairways. Check any carpeting to make sure that it is firmly attached to the stairs. Fix any carpet that is loose or worn. Avoid having throw rugs at the top or bottom of the stairs. If you do have throw rugs, attach them to the floor with carpet  tape. Make sure that you have a light switch at the top of the stairs and the bottom of the stairs. If you do not have them, ask someone to add them for you. What else can I do to help prevent falls? Wear shoes that: Do not have high heels. Have rubber bottoms. Are comfortable and fit you well. Are closed at the toe. Do not wear sandals. If you use a stepladder: Make sure that it is fully opened. Do not climb a closed stepladder. Make sure that both sides of the stepladder are locked into place. Ask someone to hold it for you, if possible. Clearly mark and make sure that you can see: Any grab bars or handrails. First and last steps. Where the edge of each step is. Use tools that help you move around (mobility aids) if they are needed. These include: Canes. Walkers. Scooters. Crutches. Turn on the lights when you go into a dark area. Replace any light bulbs as soon as they burn out. Set up your furniture so you have a clear path. Avoid moving your furniture around. If any of your floors are uneven, fix them. If there are any pets around you, be aware of where they are. Review your medicines with your doctor. Some medicines can make you feel dizzy. This can increase your chance of falling. Ask your doctor what other things that you can do to help prevent falls. This information is not intended to replace advice given to you by your health care provider. Make sure you discuss any questions you have with your health care provider. Document Released: 10/28/2008 Document Revised: 06/09/2015 Document Reviewed: 02/05/2014 Elsevier Interactive Patient Education  2017 [employer].

## 2021-12-18 NOTE — Progress Notes (Signed)
Subjective:   Ann Valdez is a 66 y.o. female who presents for Medicare Annual (Subsequent) preventive examination. I connected with  Ann Valdez on 12/18/21 by a audio enabled telemedicine application and verified that I am speaking with the correct person using two identifiers.  Patient Location: Home  Provider Location: Home Office  I discussed the limitations of evaluation and management by telemedicine. The patient expressed understanding and agreed to proceed.  Review of Systems     Cardiac Risk Factors include: advanced age (>50mn, >>108women)     Objective:    Today's Vitals   12/18/21 1029  Weight: 199 lb (90.3 kg)  Height: '5\' 1"'$  (1.549 m)   Body mass index is 37.6 kg/m.     12/18/2021   10:33 AM 01/11/2021    6:45 AM 12/13/2020   11:03 AM  Advanced Directives  Does Patient Have a Medical Advance Directive? No No No  Would patient like information on creating a medical advance directive? No - Patient declined No - Patient declined No - Patient declined    Current Medications (verified) Outpatient Encounter Medications as of 12/18/2021  Medication Sig   atorvastatin (LIPITOR) 20 MG tablet Take 1 tablet (20 mg total) by mouth daily.   bisacodyl 5 MG EC tablet Take 1 tablet (5 mg total) by mouth daily as needed for moderate constipation. (Patient taking differently: Take 10 mg by mouth daily at 8 pm.)   No facility-administered encounter medications on file as of 12/18/2021.    Allergies (verified) Ciprofloxacin   History: Past Medical History:  Diagnosis Date   Medical history non-contributory    Past Surgical History:  Procedure Laterality Date   COLONOSCOPY N/A 01/11/2021   Procedure: COLONOSCOPY;  Surgeon: RDaneil Dolin MD;  Location: [facility];  Service: Endoscopy;  Laterality: N/A;  7:30AM   COSMETIC SURGERY     Family History  Problem Relation Age of Onset   Colon cancer Neg Hx    Colon polyps Neg Hx    Social History    Socioeconomic History   Marital status: Married    Spouse name: Not on file   Number of children: 3   Years of education: Not on file   Highest education level: 12th grade  Occupational History   Not on file  Tobacco Use   Smoking status: Never   Smokeless tobacco: Never  Vaping Use   Vaping Use: Never used  Substance and Sexual Activity   Alcohol use: Never   Drug use: Never   Sexual activity: Yes    Birth control/protection: None  Other Topics Concern   Not on file  Social History Narrative   Not on file   Social Determinants of Health   Financial Resource Strain: Low Risk  (12/18/2021)   Overall Financial Resource Strain (CARDIA)    Difficulty of Paying Living Expenses: Not hard at all  Food Insecurity: No Food Insecurity (12/18/2021)   Hunger Vital Sign    Worried About Running Out of Food in the Last Year: Never true    Ran Out of Food in the Last Year: Never true  Transportation Needs: No Transportation Needs (12/13/2020)   PRAPARE - THydrologist(Medical): No    Lack of Transportation (Non-Medical): No  Physical Activity: Insufficiently Active (12/13/2020)   Exercise Vital Sign    Days of Exercise per Week: 3 days    Minutes of Exercise per Session: 10 min  Stress: No  Stress Concern Present (12/18/2021)   [place]    Feeling of Stress : Not at all  Social Connections: Moderately Isolated (12/18/2021)   Social Connection and Isolation Panel [NHANES]    Frequency of Communication with Friends and Family: More than three times a week    Frequency of Social Gatherings with Friends and Family: More than three times a week    Attends Religious Services: Never    Marine scientist or Organizations: No    Attends Music therapist: Never    Marital Status: Married    Tobacco Counseling Counseling given: Not Answered   Clinical Intake:  Pre-visit  preparation completed: Yes  Pain : No/denies pain     Nutritional Risks: None Diabetes: No  How often do you need to have someone help you when you read instructions, pamphlets, or other written materials from your doctor or pharmacy?: 1 - Never  Diabetic?no  Interpreter Needed?: No  Information entered by :: Jadene Pierini, LPN   Activities of Daily Living    12/18/2021   10:32 AM  In your present state of health, do you have any difficulty performing the following activities:  Hearing? 0  Vision? 0  Difficulty concentrating or making decisions? 0  Walking or climbing stairs? 0  Dressing or bathing? 0  Doing errands, shopping? 0  Preparing Food and eating ? N  Using the Toilet? N  In the past six months, have you accidently leaked urine? N  Do you have problems with loss of bowel control? N  Managing your Medications? N  Managing your Finances? N  Housekeeping or managing your Housekeeping? N    Patient Care Team: Baruch Gouty, FNP as PCP - General (Family Medicine)  Indicate any recent Medical Services you may have received from other than [facility] providers in the past year (date may be approximate).     Assessment:   This is a routine wellness examination for Ann Valdez.  Hearing/Vision screen Vision Screening - Comments:: Wears rx glasses - up to date with routine eye exams with  Dr. Wynetta Emery   Dietary issues and exercise activities discussed: Current Exercise Habits: Home exercise routine, Type of exercise: walking, Time (Minutes): 30, Frequency (Times/Week): 3, Weekly Exercise (Minutes/Week): 90, Intensity: Mild, Exercise limited by: None identified   Goals Addressed             This Visit's Progress    Exercise 3x per week (30 min per time)         Depression Screen    12/18/2021   10:31 AM 11/29/2021   10:14 AM 05/25/2021   11:01 AM 02/21/2021   10:43 AM 12/13/2020   11:05 AM 12/13/2020   11:03 AM 10/14/2020    8:43 AM  PHQ 2/9 Scores  PHQ - 2 Score  0 0 0 0 0 0 0  PHQ- 9 Score 0 0 0    0    Fall Risk    12/18/2021   10:30 AM 11/29/2021   10:14 AM 05/25/2021   11:02 AM 02/21/2021   10:43 AM 12/13/2020   11:05 AM  [employer] in the past year? 0 0 0 0 0  Number falls in past yr: 0      Injury with Fall? 0      Risk for fall due to : No Fall Risks      Follow up Falls prevention discussed  FALL RISK PREVENTION PERTAINING TO THE HOME:  Any stairs in or around the home? No  If so, are there any without handrails? No  Home free of loose throw rugs in walkways, pet beds, electrical cords, etc? Yes  Adequate lighting in your home to reduce risk of falls? Yes   ASSISTIVE DEVICES UTILIZED TO PREVENT FALLS:  Life alert? No  Use of a cane, walker or w/c? No  Grab bars in the bathroom? No  Shower chair or bench in shower? No  Elevated toilet seat or a handicapped toilet? No          12/18/2021   10:32 AM 12/13/2020   11:07 AM  6CIT Screen  What Year? 0 points 0 points  What month? 0 points 0 points  What time? 0 points 0 points  Count back from 20 0 points 0 points  Months in reverse 0 points 0 points  Repeat phrase 0 points 0 points  Total Score 0 points 0 points    Immunizations Immunization History  Administered Date(s) Administered   PFIZER(Purple Top)SARS-COV-2 Vaccination 09/18/2019, 10/09/2019    TDAP status: Due, Education has been provided regarding the importance of this vaccine. Advised may receive this vaccine at local pharmacy or Health Dept. Aware to provide a copy of the vaccination record if obtained from local pharmacy or Health Dept. Verbalized acceptance and understanding.  Flu Vaccine status: Due, Education has been provided regarding the importance of this vaccine. Advised may receive this vaccine at local pharmacy or Health Dept. Aware to provide a copy of the vaccination record if obtained from local pharmacy or Health Dept. Verbalized acceptance and understanding.  Pneumococcal  vaccine status: Due, Education has been provided regarding the importance of this vaccine. Advised may receive this vaccine at local pharmacy or Health Dept. Aware to provide a copy of the vaccination record if obtained from local pharmacy or Health Dept. Verbalized acceptance and understanding.  Covid-19 vaccine status: Completed vaccines  Qualifies for Shingles Vaccine? Yes   Zostavax completed No   Shingrix Completed?: No.    Education has been provided regarding the importance of this vaccine. Patient has been advised to call insurance company to determine out of pocket expense if they have not yet received this vaccine. Advised may also receive vaccine at local pharmacy or Health Dept. Verbalized acceptance and understanding.  Screening Tests Health Maintenance  Topic Date Due   DTaP/Tdap/Td (1 - Tdap) Never done   Pneumonia Vaccine 36+ Years old (1 - PCV) Never done   COVID-19 Vaccine (3 - 2023-24 season) 09/15/2021   Zoster Vaccines- Shingrix (1 of 2) 03/01/2022 (Originally 08/05/2005)   INFLUENZA VACCINE  04/15/2022 (Originally 08/15/2021)   MAMMOGRAM  11/30/2022 (Originally 08/05/2005)   Hepatitis C Screening  11/30/2022 (Originally [birth year]1973)   Medicare Annual Wellness (AWV)  12/19/2022   COLONOSCOPY (Pts 45-69yr Insurance coverage will need to be confirmed)  01/12/2031   DEXA SCAN  Completed   HPV VACCINES  Aged Out    Health Maintenance  Health Maintenance Due  Topic Date Due   DTaP/Tdap/Td (1 - Tdap) Never done   Pneumonia Vaccine 66 Years old (1 - PCV) Never done   COVID-19 Vaccine (3 - 2023-24 season) 09/15/2021    Colorectal cancer screening: Type of screening: Colonoscopy. Completed 01/11/2021. Repeat every 10 years  Mammogram status: Ordered declined at this time . Pt provided with contact info and advised to call to schedule appt.   Bone Density status: Completed 08/29/2020. Results reflect: Bone  density results: OSTEOPENIA. Repeat every 5 years.  Lung Cancer  Screening: (Low Dose CT Chest recommended if Age 59-80 years, 30 pack-year currently smoking OR have quit w/in 15years.) does not qualify.   Lung Cancer Screening Referral: n/a  Additional Screening:  Hepatitis C Screening: does not qualify;   Vision Screening: Recommended annual ophthalmology exams for early detection of glaucoma and other disorders of the eye. Is the patient up to date with their annual eye exam?  No  Who is the provider or what is the name of the office in which the patient attends annual eye exams? Dr,johnson  If pt is not established with a provider, would they like to be referred to a provider to establish care? No .   Dental Screening: Recommended annual dental exams for proper oral hygiene  Community Resource Referral / Chronic Care Management: CRR required this visit?  No   CCM required this visit?  No      Plan:     I have personally reviewed and noted the following in the patient's chart:   Medical and social history Use of alcohol, tobacco or illicit drugs  Current medications and supplements including opioid prescriptions. Patient is not currently taking opioid prescriptions. Functional ability and status Nutritional status Physical activity Advanced directives List of other physicians Hospitalizations, surgeries, and ER visits in previous 12 months Vitals Screenings to include cognitive, depression, and falls Referrals and appointments  In addition, I have reviewed and discussed with patient certain preventive protocols, quality metrics, and best practice recommendations. A written personalized care plan for preventive services as well as general preventive health recommendations were provided to patient.     Daphane Shepherd, LPN   [id number]   Nurse Notes: no vaccines in file , declines Mammogram

## 2021-12-27 ENCOUNTER — Encounter: Payer: Self-pay | Admitting: Family Medicine

## 2021-12-27 ENCOUNTER — Ambulatory Visit (INDEPENDENT_AMBULATORY_CARE_PROVIDER_SITE_OTHER): Payer: Medicare PPO | Admitting: Family Medicine

## 2021-12-27 VITALS — BP 134/67 | HR 97 | Temp 97.5°F | Ht 61.0 in | Wt 203.4 lb

## 2021-12-27 DIAGNOSIS — R6889 Other general symptoms and signs: Secondary | ICD-10-CM | POA: Diagnosis not present

## 2021-12-27 DIAGNOSIS — L608 Other nail disorders: Secondary | ICD-10-CM | POA: Diagnosis not present

## 2021-12-27 DIAGNOSIS — B079 Viral wart, unspecified: Secondary | ICD-10-CM

## 2021-12-27 DIAGNOSIS — L659 Nonscarring hair loss, unspecified: Secondary | ICD-10-CM

## 2021-12-27 DIAGNOSIS — E039 Hypothyroidism, unspecified: Secondary | ICD-10-CM

## 2021-12-27 NOTE — Progress Notes (Signed)
Subjective:  Patient ID: Ann Valdez, female    DOB: May 16, 1955, 66 y.o.   MRN: [id number]  Patient Care Team: Baruch Gouty, FNP as PCP - General (Family Medicine)   Chief Complaint:  cyro (4 week follow up- verruca vulgaris/)   HPI: Ann Valdez is a 66 y.o. female presenting on 12/27/2021 for cyro (4 week follow up- verruca vulgaris/)   Pt presents today for retreatment of lesion to left index finger. Did well with last cryotherapy. She also reports increased hair loss and thinning brittle nails. This has been ongoing for several months.      Relevant past medical, surgical, family, and social history reviewed and updated as indicated.  Allergies and medications reviewed and updated. Data reviewed: Chart in Epic.   Past Medical History:  Diagnosis Date   Medical history non-contributory     Past Surgical History:  Procedure Laterality Date   COLONOSCOPY N/A 01/11/2021   Procedure: COLONOSCOPY;  Surgeon: Daneil Dolin, MD;  Location: [facility];  Service: Endoscopy;  Laterality: N/A;  7:30AM   COSMETIC SURGERY      Social History   Socioeconomic History   Marital status: Married    Spouse name: Not on file   Number of children: 3   Years of education: Not on file   Highest education level: 12th grade  Occupational History   Not on file  Tobacco Use   Smoking status: Never   Smokeless tobacco: Never  Vaping Use   Vaping Use: Never used  Substance and Sexual Activity   Alcohol use: Never   Drug use: Never   Sexual activity: Yes    Birth control/protection: None  Other Topics Concern   Not on file  Social History Narrative   Not on file   Social Determinants of Health   Financial Resource Strain: Low Risk  (12/18/2021)   Overall Financial Resource Strain (CARDIA)    Difficulty of Paying Living Expenses: Not hard at all  Food Insecurity: No Food Insecurity (12/18/2021)   Hunger Vital Sign    Worried About Running Out of Food in the  Last Year: Never true    [place] in the Last Year: Never true  Transportation Needs: No Transportation Needs (12/13/2020)   PRAPARE - Hydrologist (Medical): No    Lack of Transportation (Non-Medical): No  Physical Activity: Insufficiently Active (12/13/2020)   Exercise Vital Sign    Days of Exercise per Week: 3 days    Minutes of Exercise per Session: 10 min  Stress: No Stress Concern Present (12/18/2021)   [place]    Feeling of Stress : Not at all  Social Connections: Moderately Isolated (12/18/2021)   Social Connection and Isolation Panel [NHANES]    Frequency of Communication with Friends and Family: More than three times a week    Frequency of Social Gatherings with Friends and Family: More than three times a week    Attends Religious Services: Never    Marine scientist or Organizations: No    Attends Archivist Meetings: Never    Marital Status: Married  Human resources officer Violence: Not At Risk (12/18/2021)   Humiliation, Afraid, Rape, and Kick questionnaire    Fear of Current or Ex-Partner: No    Emotionally Abused: No    Physically Abused: No    Sexually Abused: No    Outpatient Encounter Medications  as of 12/27/2021  Medication Sig   atorvastatin (LIPITOR) 20 MG tablet Take 1 tablet (20 mg total) by mouth daily.   bisacodyl 5 MG EC tablet Take 1 tablet (5 mg total) by mouth daily as needed for moderate constipation. (Patient taking differently: Take 10 mg by mouth daily at 8 pm.)   No facility-administered encounter medications on file as of 12/27/2021.    Allergies  Allergen Reactions   Ciprofloxacin Rash    Review of Systems  Constitutional:  Negative for activity change, appetite change, chills, diaphoresis, fatigue, fever and unexpected weight change.  HENT: Negative.    Eyes: Negative.  Negative for photophobia and visual disturbance.   Respiratory:  Negative for cough, chest tightness and shortness of breath.   Cardiovascular:  Negative for chest pain, palpitations and leg swelling.  Gastrointestinal:  Negative for abdominal pain, blood in stool, constipation, diarrhea, nausea and vomiting.  Endocrine: Negative.  Negative for cold intolerance, heat intolerance, polydipsia, polyphagia and polyuria.  Genitourinary:  Negative for decreased urine volume, difficulty urinating, dysuria, frequency, urgency, vaginal bleeding, vaginal discharge and vaginal pain.  Musculoskeletal:  Negative for arthralgias and myalgias.  Skin:        Skin lesion to left index finger. Hair thin and coarse. Nails brittle and thin.   Allergic/Immunologic: Negative.   Neurological:  Negative for dizziness, tremors, seizures, syncope, facial asymmetry, speech difficulty, weakness, light-headedness, numbness and headaches.  Hematological: Negative.   Psychiatric/Behavioral:  Negative for confusion, hallucinations, sleep disturbance and suicidal ideas.   All other systems reviewed and are negative.       Objective:  BP 134/67   Pulse 97   Temp (!) 97.5 F (36.4 C) (Temporal)   Ht 5' 1" (1.549 m)   Wt 203 lb 6.4 oz (92.3 kg)   SpO2 93%   BMI 38.43 kg/m    Wt Readings from Last 3 Encounters:  12/27/21 203 lb 6.4 oz (92.3 kg)  12/18/21 199 lb (90.3 kg)  11/29/21 199 lb 3.2 oz (90.4 kg)    Physical Exam Vitals and nursing note reviewed.  Constitutional:      General: She is not in acute distress.    Appearance: Normal appearance. She is obese. She is not ill-appearing, toxic-appearing or diaphoretic.  HENT:     Head: Normocephalic and atraumatic.  Eyes:     Pupils: Pupils are equal, round, and reactive to light.  Cardiovascular:     Rate and Rhythm: Normal rate and regular rhythm.     Heart sounds: Normal heart sounds.  Pulmonary:     Effort: Pulmonary effort is normal.     Breath sounds: Normal breath sounds.  Skin:    General: Skin  is warm and dry.     Capillary Refill: Capillary refill takes less than 2 seconds.     Findings: Lesion present.     Comments: Nails brittle and cracking, thin. Hair coarse and thin.  Neurological:     General: No focal deficit present.     Mental Status: She is alert and oriented to person, place, and time.  Psychiatric:        Mood and Affect: Mood normal.        Behavior: Behavior normal.        Thought Content: Thought content normal.        Judgment: Judgment normal.     Results for orders placed or performed in visit on 11/29/21  CBC with Differential/Platelet  Result Value Ref Range   WBC  6.3 3.4 - 10.8 x10E3/uL   RBC 5.22 3.77 - 5.28 x10E6/uL   Hemoglobin 14.5 11.1 - 15.9 g/dL   Hematocrit 44.3 34.0 - 46.6 %   MCV 85 79 - 97 fL   MCH 27.8 26.6 - 33.0 pg   MCHC 32.7 31.5 - 35.7 g/dL   RDW 13.7 11.7 - 15.4 %   Platelets 234 150 - 450 x10E3/uL   Neutrophils 58 Not Estab. %   Lymphs 32 Not Estab. %   Monocytes 7 Not Estab. %   Eos 2 Not Estab. %   Basos 1 Not Estab. %   Neutrophils Absolute 3.7 1.4 - 7.0 x10E3/uL   Lymphocytes Absolute 2.0 0.7 - 3.1 x10E3/uL   Monocytes Absolute 0.4 0.1 - 0.9 x10E3/uL   EOS (ABSOLUTE) 0.1 0.0 - 0.4 x10E3/uL   Basophils Absolute 0.0 0.0 - 0.2 x10E3/uL   Immature Granulocytes 0 Not Estab. %   Immature Grans (Abs) 0.0 0.0 - 0.1 x10E3/uL  CMP14+EGFR  Result Value Ref Range   Glucose 104 (H) 70 - 99 mg/dL   BUN 17 8 - 27 mg/dL   Creatinine, Ser 0.81 0.57 - 1.00 mg/dL   eGFR 80 >59 mL/min/1.73   BUN/Creatinine Ratio 21 12 - 28   Sodium 142 134 - 144 mmol/L   Potassium 4.3 3.5 - 5.2 mmol/L   Chloride 104 96 - 106 mmol/L   CO2 19 (L) 20 - 29 mmol/L   Calcium 9.5 8.7 - 10.3 mg/dL   Total Protein 6.6 6.0 - 8.5 g/dL   Albumin 4.4 3.9 - 4.9 g/dL   Globulin, Total 2.2 1.5 - 4.5 g/dL   Albumin/Globulin Ratio 2.0 1.2 - 2.2   Bilirubin Total 0.3 0.0 - 1.2 mg/dL   Alkaline Phosphatase 64 44 - 121 IU/L   AST 23 0 - 40 IU/L   ALT 27 0 - 32  IU/L  Lipid panel  Result Value Ref Range   Cholesterol, Total 229 (H) 100 - 199 mg/dL   Triglycerides 86 0 - 149 mg/dL   HDL 73 >39 mg/dL   VLDL Cholesterol Cal 15 5 - 40 mg/dL   LDL Chol Calc (NIH) 141 (H) 0 - 99 mg/dL   Chol/HDL Ratio 3.1 0.0 - 4.4 ratio  Thyroid Panel With TSH  Result Value Ref Range   TSH 4.990 (H) 0.450 - 4.500 uIU/mL   T4, Total 6.4 4.5 - 12.0 ug/dL   T3 Uptake Ratio 25 24 - 39 %   Free Thyroxine Index 1.6 1.2 - 4.9  VITAMIN D 25 Hydroxy (Vit-D Deficiency, Fractures)  Result Value Ref Range   Vit D, 25-Hydroxy 50.3 30.0 - 100.0 ng/mL    Skin excision  Date/Time: 12/27/2021 12:40 PM  Performed by: Baruch Gouty, FNP Authorized by: Baruch Gouty, FNP   Number of Lesions: 1 Lesion 1:    Body area: upper extremity   Upper extremity location: L index finger   Initial size (mm): 2   Malignancy: malignancy unknown     Destruction method: cryotherapy      Pertinent labs & imaging results that were available during my care of the patient were reviewed by me and considered in my medical decision making.  Assessment & Plan:  Klynn was seen today for cyro.  Diagnoses and all orders for this visit:  Verruca vulgaris Cryotherpay in office with three freeze thaw cycles, tolerated well.   Change in nail appearance Hair loss Coarse thinning hair, brittle cracking nails. Will check below  for potential underlying causes.  -     Hormone Panel -     Anemia Profile B -     ANA Comprehensive Panel     Continue all other maintenance medications.  Follow up plan: Return in about 4 weeks (around 01/24/2022) for cryo.   Continue healthy lifestyle choices, including diet (rich in fruits, vegetables, and lean proteins, and low in salt and simple carbohydrates) and exercise (at least 30 minutes of moderate physical activity daily).  Educational handout given for cryotherapy   The above assessment and management plan was discussed with the patient. The patient  verbalized understanding of and has agreed to the management plan. Patient is aware to call the clinic if they develop any new symptoms or if symptoms persist or worsen. Patient is aware when to return to the clinic for a follow-up visit. Patient educated on when it is appropriate to go to the emergency department.   Monia Pouch, FNP-C [facility] [phone]

## 2022-01-03 ENCOUNTER — Telehealth: Payer: Self-pay | Admitting: Family Medicine

## 2022-01-03 NOTE — Telephone Encounter (Signed)
Pt is aware that once all results have resulted she will get a call.

## 2022-01-03 NOTE — Telephone Encounter (Signed)
Patient had labs completed on 12/13 and said she was told that her PCP would be calling her directly, that it would not be a nurse. Please review and call back/

## 2022-01-09 LAB — ANEMIA PROFILE B
Basophils Absolute: 0 10*3/uL (ref 0.0–0.2)
Basos: 0 %
EOS (ABSOLUTE): 0.1 10*3/uL (ref 0.0–0.4)
Eos: 2 %
Ferritin: 54 ng/mL (ref 15–150)
Folate: >20 ng/mL (ref >3.0)
Hematocrit: 46.7 % — ABNORMAL HIGH (ref 34.0–46.6)
Hemoglobin: 14.5 g/dL (ref 11.1–15.9)
Immature Grans (Abs): 0 10*3/uL (ref 0.0–0.1)
Immature Granulocytes: 0 %
Iron Saturation: 28 % (ref 15–55)
Iron: 90 ug/dL (ref 27–139)
Lymphocytes Absolute: 1.8 10*3/uL (ref 0.7–3.1)
Lymphs: 28 %
MCH: 27 pg (ref 26.6–33.0)
MCHC: 31 g/dL — ABNORMAL LOW (ref 31.5–35.7)
MCV: 87 fL (ref 79–97)
Monocytes Absolute: 0.3 10*3/uL (ref 0.1–0.9)
Monocytes: 5 %
Neutrophils Absolute: 4 10*3/uL (ref 1.4–7.0)
Neutrophils: 65 %
Platelets: 233 10*3/uL (ref 150–450)
RBC: 5.38 x10E6/uL — ABNORMAL HIGH (ref 3.77–5.28)
RDW: 13.8 % (ref 11.7–15.4)
Retic Ct Pct: 1.2 % (ref 0.6–2.6)
Total Iron Binding Capacity: 321 ug/dL (ref 250–450)
UIBC: 231 ug/dL (ref 118–369)
Vitamin B-12: >2000 pg/mL — ABNORMAL HIGH (ref 232–1245)
WBC: 6.2 10*3/uL (ref 3.4–10.8)

## 2022-01-09 LAB — HORMONE PANEL (T4,TSH,FSH,TESTT,SHBG,DHEA,ETC)
DHEA-Sulfate, LCMS: 70 ug/dL
Estradiol, Serum, MS: 5.7 pg/mL
Estrone Sulfate: 44 ng/dL
Follicle Stimulating Hormone: 52 m[IU]/mL
Free T-3: 3 pg/mL
Free Testosterone, Serum: 2.2 pg/mL
Progesterone, Serum: <10 ng/dL
Sex Hormone Binding Globulin: 32.4 nmol/L
T4: 5.9 ug/dL
TSH: 5.1 uU/mL — ABNORMAL HIGH
Testosterone, Serum (Total): 17 ng/dL
Testosterone-% Free: 1.3 %
Triiodothyronine (T-3), Serum: 101 ng/dL

## 2022-01-09 LAB — ANA COMPREHENSIVE PANEL
Anti JO-1: <0.2 AI (ref 0.0–0.9)
Centromere Ab Screen: <0.2 AI (ref 0.0–0.9)
Chromatin Ab SerPl-aCnc: <0.2 AI (ref 0.0–0.9)
ENA RNP Ab: <0.2 AI (ref 0.0–0.9)
ENA SM Ab Ser-aCnc: <0.2 AI (ref 0.0–0.9)
ENA SSA (RO) Ab: <0.2 AI (ref 0.0–0.9)
ENA SSB (LA) Ab: <0.2 AI (ref 0.0–0.9)
Scleroderma (Scl-70) (ENA) Antibody, IgG: <0.2 AI (ref 0.0–0.9)
dsDNA Ab: 1 IU/mL (ref 0–9)

## 2022-01-10 MED ORDER — LEVOTHYROXINE SODIUM 25 MCG PO TABS: 25.0000 ug | ORAL_TABLET | Freq: Every day | ORAL | 3 refills | Status: DC

## 2022-01-10 NOTE — Addendum Note (Signed)
Addended by: Baruch Gouty on: 01/10/2022 08:00 AM   Modules accepted: Orders

## 2022-01-16 DIAGNOSIS — H524 Presbyopia: Secondary | ICD-10-CM | POA: Diagnosis not present

## 2022-01-16 DIAGNOSIS — H52223 Regular astigmatism, bilateral: Secondary | ICD-10-CM | POA: Diagnosis not present

## 2022-01-24 ENCOUNTER — Ambulatory Visit: Payer: Medicare PPO | Admitting: Family Medicine

## 2022-01-25 ENCOUNTER — Encounter: Payer: Self-pay | Admitting: Family Medicine

## 2022-01-25 ENCOUNTER — Ambulatory Visit: Payer: No Typology Code available for payment source

## 2022-01-25 VITALS — BP 130/75 | HR 92 | Temp 96.9°F | Ht 61.0 in | Wt 204.0 lb

## 2022-01-25 DIAGNOSIS — B079 Viral wart, unspecified: Secondary | ICD-10-CM | POA: Diagnosis not present

## 2022-01-25 NOTE — Progress Notes (Signed)
Subjective:  Patient ID: Ann Valdez, female    DOB: 02-10-1955, 67 y.o.   MRN: [id number]  Patient Care Team: Baruch Gouty, FNP as PCP - General (Family Medicine)   Chief Complaint:  cryo (Verruca vulgaris)   HPI: Ann Valdez is a 67 y.o. female presenting on 01/25/2022 for cryo (Verruca vulgaris)   Pt returning for repeat cryo therapy to left index finger verruca vulgaris. She has had two prior cryo treatments with decreased size of lesion. No pain, swelling, erythema, or drainage. No new lesions.      Relevant past medical, surgical, family, and social history reviewed and updated as indicated.  Allergies and medications reviewed and updated. Data reviewed: Chart in Epic.   Past Medical History:  Diagnosis Date   Medical history non-contributory     Past Surgical History:  Procedure Laterality Date   COLONOSCOPY N/A 01/11/2021   Procedure: COLONOSCOPY;  Surgeon: Daneil Dolin, MD;  Location: [facility];  Service: Endoscopy;  Laterality: N/A;  7:30AM   COSMETIC SURGERY      Social History   Socioeconomic History   Marital status: Married    Spouse name: Not on file   Number of children: 3   Years of education: Not on file   Highest education level: 12th grade  Occupational History   Not on file  Tobacco Use   Smoking status: Never   Smokeless tobacco: Never  Vaping Use   Vaping Use: Never used  Substance and Sexual Activity   Alcohol use: Never   Drug use: Never   Sexual activity: Yes    Birth control/protection: None  Other Topics Concern   Not on file  Social History Narrative   Not on file   Social Determinants of Health   Financial Resource Strain: Low Risk  (12/18/2021)   Overall Financial Resource Strain (CARDIA)    Difficulty of Paying Living Expenses: Not hard at all  Food Insecurity: No Food Insecurity (12/18/2021)   Hunger Vital Sign    Worried About Running Out of Food in the Last Year: Never true    [place] in the Last Year: Never true  Transportation Needs: No Transportation Needs (12/13/2020)   PRAPARE - Hydrologist (Medical): No    Lack of Transportation (Non-Medical): No  Physical Activity: Insufficiently Active (12/13/2020)   Exercise Vital Sign    Days of Exercise per Week: 3 days    Minutes of Exercise per Session: 10 min  Stress: No Stress Concern Present (12/18/2021)   [place]    Feeling of Stress : Not at all  Social Connections: Moderately Isolated (12/18/2021)   Social Connection and Isolation Panel [NHANES]    Frequency of Communication with Friends and Family: More than three times a week    Frequency of Social Gatherings with Friends and Family: More than three times a week    Attends Religious Services: Never    Marine scientist or Organizations: No    Attends Archivist Meetings: Never    Marital Status: Married  Human resources officer Violence: Not At Risk (12/18/2021)   Humiliation, Afraid, Rape, and Kick questionnaire    Fear of Current or Ex-Partner: No    Emotionally Abused: No    Physically Abused: No    Sexually Abused: No    Outpatient Encounter Medications as of 01/25/2022  Medication Sig  atorvastatin (LIPITOR) 20 MG tablet Take 1 tablet (20 mg total) by mouth daily.   bisacodyl 5 MG EC tablet Take 1 tablet (5 mg total) by mouth daily as needed for moderate constipation. (Patient taking differently: Take 10 mg by mouth daily at 8 pm.)   levothyroxine (SYNTHROID) 25 MCG tablet Take 1 tablet (25 mcg total) by mouth daily.   No facility-administered encounter medications on file as of 01/25/2022.    Allergies  Allergen Reactions   Ciprofloxacin Rash    Review of Systems  Constitutional:  Negative for activity change, appetite change, chills, diaphoresis, fatigue, fever and unexpected weight change.  Skin:        Lesion to left index finger   Neurological:  Negative for weakness.  Psychiatric/Behavioral:  Negative for confusion.         Objective:  BP 130/75   Pulse 92   Temp (!) 96.9 F (36.1 C) (Temporal)   Ht '5\' 1"'$  (1.549 m)   Wt 204 lb (92.5 kg)   SpO2 93%   BMI 38.55 kg/m    Wt Readings from Last 3 Encounters:  01/25/22 204 lb (92.5 kg)  12/27/21 203 lb 6.4 oz (92.3 kg)  12/18/21 199 lb (90.3 kg)    Physical Exam Vitals and nursing note reviewed.  Constitutional:      General: She is not in acute distress.    Appearance: Normal appearance. She is obese. She is not ill-appearing, toxic-appearing or diaphoretic.  HENT:     Head: Normocephalic and atraumatic.  Eyes:     Pupils: Pupils are equal, round, and reactive to light.  Cardiovascular:     Rate and Rhythm: Normal rate and regular rhythm.     Heart sounds: Normal heart sounds.  Pulmonary:     Effort: Pulmonary effort is normal.     Breath sounds: Normal breath sounds.  Musculoskeletal:     Cervical back: Normal range of motion and neck supple.  Skin:    General: Skin is warm and dry.     Capillary Refill: Capillary refill takes less than 2 seconds.     Findings: Lesion (raised hyperkeratotic papule to left index finger) present.  Neurological:     General: No focal deficit present.     Mental Status: She is alert and oriented to person, place, and time.  Psychiatric:        Mood and Affect: Mood normal.        Behavior: Behavior normal.        Thought Content: Thought content normal.        Judgment: Judgment normal.     Results for orders placed or performed in visit on 12/27/21  Hormone Panel  Result Value Ref Range   TSH 5.1 (H) uU/mL   T4 5.9 ug/dL   Free T-3 3.0 pg/mL   Triiodothyronine (T-3), Serum 101 ng/dL   Testosterone, Serum (Total) 17 ng/dL   Free Testosterone, Serum 2.2 pg/mL   Testosterone-% Free 1.3 %   Follicle Stimulating Hormone 52 mIU/mL   Progesterone, Serum <10 ng/dL   DHEA-Sulfate, LCMS 70 ug/dL   Sex  Hormone Binding Globulin 32.4 nmol/L   Estrone Sulfate 44 ng/dL   Estradiol, Serum, MS 5.7 pg/mL  Anemia Profile B  Result Value Ref Range   Total Iron Binding Capacity 321 250 - 450 ug/dL   UIBC 231 118 - 369 ug/dL   Iron 90 27 - 139 ug/dL   Iron Saturation 28 15 - 55 %  Ferritin 54 15 - 150 ng/mL   Vitamin B-12 >2000 (H) 232 - 1245 pg/mL   Folate >20.0 >3.0 ng/mL   WBC 6.2 3.4 - 10.8 x10E3/uL   RBC 5.38 (H) 3.77 - 5.28 x10E6/uL   Hemoglobin 14.5 11.1 - 15.9 g/dL   Hematocrit 46.7 (H) 34.0 - 46.6 %   MCV 87 79 - 97 fL   MCH 27.0 26.6 - 33.0 pg   MCHC 31.0 (L) 31.5 - 35.7 g/dL   RDW 13.8 11.7 - 15.4 %   Platelets 233 150 - 450 x10E3/uL   Neutrophils 65 Not Estab. %   Lymphs 28 Not Estab. %   Monocytes 5 Not Estab. %   Eos 2 Not Estab. %   Basos 0 Not Estab. %   Neutrophils Absolute 4.0 1.4 - 7.0 x10E3/uL   Lymphocytes Absolute 1.8 0.7 - 3.1 x10E3/uL   Monocytes Absolute 0.3 0.1 - 0.9 x10E3/uL   EOS (ABSOLUTE) 0.1 0.0 - 0.4 x10E3/uL   Basophils Absolute 0.0 0.0 - 0.2 x10E3/uL   Immature Granulocytes 0 Not Estab. %   Immature Grans (Abs) 0.0 0.0 - 0.1 x10E3/uL   Retic Ct Pct 1.2 0.6 - 2.6 %  ANA Comprehensive Panel  Result Value Ref Range   dsDNA Ab 1 0 - 9 IU/mL   ENA RNP Ab <0.2 0.0 - 0.9 AI   ENA SM Ab Ser-aCnc <0.2 0.0 - 0.9 AI   Scleroderma (Scl-70) (ENA) Antibody, IgG <0.2 0.0 - 0.9 AI   ENA SSA (RO) Ab <0.2 0.0 - 0.9 AI   ENA SSB (LA) Ab <0.2 0.0 - 0.9 AI   Chromatin Ab SerPl-aCnc <0.2 0.0 - 0.9 AI   Anti JO-1 <0.2 0.0 - 0.9 AI   Centromere Ab Screen <0.2 0.0 - 0.9 AI   See below: Comment      Skin excision  Date/Time: 01/25/2022 2:31 PM  Performed by: Baruch Gouty, FNP Authorized by: Baruch Gouty, FNP   Number of Lesions: 1 Lesion 1:    Body area: upper extremity   Upper extremity location: L index finger   Initial size (mm): 1.5   Malignancy: malignancy unknown     Destruction method: cryotherapy     Destruction method comment: 3 freeze thaw  cycles    Pertinent labs & imaging results that were available during my care of the patient were reviewed by me and considered in my medical decision making.  Assessment & Plan:  Leilani was seen today for cryo.  Diagnoses and all orders for this visit:  Verruca vulgaris Third round of cryotherapy. Lesion has decreased in size significantly. May require one more round of cryotherapy.  -     Skin excision     Continue all other maintenance medications.  Follow up plan: Return in about 4 weeks (around 02/22/2022) for cryo.   Continue healthy lifestyle choices, including diet (rich in fruits, vegetables, and lean proteins, and low in salt and simple carbohydrates) and exercise (at least 30 minutes of moderate physical activity daily).  Educational handout given for cryo aftercare   The above assessment and management plan was discussed with the patient. The patient verbalized understanding of and has agreed to the management plan. Patient is aware to call the clinic if they develop any new symptoms or if symptoms persist or worsen. Patient is aware when to return to the clinic for a follow-up visit. Patient educated on when it is appropriate to go to the emergency department.   Sharyn Lull  Shaden Higley, FNP-C [facility] [phone]

## 2022-02-22 ENCOUNTER — Ambulatory Visit: Payer: No Typology Code available for payment source | Admitting: Family Medicine

## 2022-02-22 ENCOUNTER — Encounter: Payer: Self-pay | Admitting: Family Medicine

## 2022-02-22 VITALS — BP 130/65 | HR 98 | Temp 97.4°F | Ht 61.0 in | Wt 207.4 lb

## 2022-02-22 DIAGNOSIS — B079 Viral wart, unspecified: Secondary | ICD-10-CM

## 2022-02-22 NOTE — Patient Instructions (Signed)
Cutaneous Wart, Verruca Vulgaris

## 2022-02-22 NOTE — Progress Notes (Signed)
Subjective:  Patient ID: Ann Valdez, female    DOB: 1955-05-05, 67 y.o.   MRN: [id number]  Patient Care Team: Baruch Gouty, FNP as PCP - General (Family Medicine)   Chief Complaint:  Verruca vulgaris (4 week follow up cyro )   HPI: Ann Valdez is a 67 y.o. 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 She has tolerated previous treatments well and the lesion has decreased significantly in size.        Relevant past medical, surgical, family, and social history reviewed and updated as indicated.  Allergies and medications reviewed and updated. Data reviewed: Chart in Epic.   Past Medical History:  Diagnosis Date   Medical history non-contributory     Past Surgical History:  Procedure Laterality Date   COLONOSCOPY N/A 01/11/2021   Procedure: COLONOSCOPY;  Surgeon: Daneil Dolin, MD;  Location: [facility];  Service: Endoscopy;  Laterality: N/A;  7:30AM   COSMETIC SURGERY      Social History   Socioeconomic History   Marital status: Married    Spouse name: Not on file   Number of children: 3   Years of education: Not on file   Highest education level: 12th grade  Occupational History   Not on file  Tobacco Use   Smoking status: Never   Smokeless tobacco: Never  Vaping Use   Vaping Use: Never used  Substance and Sexual Activity   Alcohol use: Never   Drug use: Never   Sexual activity: Yes    Birth control/protection: None  Other Topics Concern   Not on file  Social History Narrative   Not on file   Social Determinants of Health   Financial Resource Strain: Low Risk  (12/18/2021)   Overall Financial Resource Strain (CARDIA)    Difficulty of Paying Living Expenses: Not hard at all  Food Insecurity: No Food Insecurity (12/18/2021)   Hunger Vital Sign    Worried About Running Out of Food in the Last Year: Never true     [place] in the Last Year: Never true  Transportation Needs: No Transportation Needs (12/13/2020)   PRAPARE - Hydrologist (Medical): No    Lack of Transportation (Non-Medical): No  Physical Activity: Insufficiently Active (12/13/2020)   Exercise Vital Sign    Days of Exercise per Week: 3 days    Minutes of Exercise per Session: 10 min  Stress: No Stress Concern Present (12/18/2021)   [place]    Feeling of Stress : Not at all  Social Connections: Moderately Isolated (12/18/2021)   Social Connection and Isolation Panel [NHANES]    Frequency of Communication with Friends and Family: More than three times a week    Frequency of Social Gatherings with Friends and Family: More than three times a week    Attends Religious Services: Never    Marine scientist or Organizations: No    Attends Archivist Meetings: Never    Marital Status: Married  Human resources officer Violence: Not At Risk (12/18/2021)   Humiliation, Afraid, Rape, and Kick questionnaire    Fear of Current or Ex-Partner: No    Emotionally Abused: No    Physically Abused: No    Sexually Abused: No    Outpatient Encounter Medications as of  02/22/2022  Medication Sig   atorvastatin (LIPITOR) 20 MG tablet Take 1 tablet (20 mg total) by mouth daily.   bisacodyl 5 MG EC tablet Take 1 tablet (5 mg total) by mouth daily as needed for moderate constipation. (Patient taking differently: Take 10 mg by mouth daily at 8 pm.)   levothyroxine (SYNTHROID) 25 MCG tablet Take 1 tablet (25 mcg total) by mouth daily.   No facility-administered encounter medications on file as of 02/22/2022.    Allergies  Allergen Reactions   Ciprofloxacin Rash and Itching   Tetracyclines & Related Other (See Comments)    Review of Systems  Constitutional:  Negative for activity change, appetite change, chills, fatigue and fever.  HENT: Negative.     Eyes: Negative.   Respiratory:  Negative for cough, chest tightness and shortness of breath.   Cardiovascular:  Negative for chest pain, palpitations and leg swelling.  Gastrointestinal:  Negative for blood in stool, constipation, diarrhea, nausea and vomiting.  Endocrine: Negative.   Genitourinary:  Negative for dysuria, frequency and urgency.  Musculoskeletal:  Negative for arthralgias and myalgias.  Skin:        Lesion on finger  Allergic/Immunologic: Negative.   Neurological:  Negative for dizziness and headaches.  Hematological: Negative.   Psychiatric/Behavioral:  Negative for confusion, hallucinations, sleep disturbance and suicidal ideas.   All other systems reviewed and are negative.       Objective:  BP 130/65   Pulse 98   Temp (!) 97.4 F (36.3 C) (Temporal)   Ht '5\' 1"'$  (1.549 m)   Wt 207 lb 6.4 oz (94.1 kg)   SpO2 95%   BMI 39.19 kg/m    Wt Readings from Last 3 Encounters:  02/22/22 207 lb 6.4 oz (94.1 kg)  01/25/22 204 lb (92.5 kg)  12/27/21 203 lb 6.4 oz (92.3 kg)    Physical Exam Vitals and nursing note reviewed.  Constitutional:      General: She is not in acute distress.    Appearance: Normal appearance. She is obese. She is not ill-appearing, toxic-appearing or diaphoretic.  HENT:     Head: Normocephalic and atraumatic.  Eyes:     Pupils: Pupils are equal, round, and reactive to light.  Cardiovascular:     Rate and Rhythm: Normal rate.  Pulmonary:     Effort: Pulmonary effort is normal.  Skin:    General: Skin is warm and dry.     Capillary Refill: Capillary refill takes less than 2 seconds.     Findings: Lesion (raised hyperkeratotic papule to left index finger) present.  Neurological:     Mental Status: She is alert and oriented to person, place, and time.  Psychiatric:        Mood and Affect: Mood normal.        Behavior: Behavior normal.        Thought Content: Thought content normal.        Judgment: Judgment normal.     Results  for orders placed or performed in visit on 12/27/21  Hormone Panel  Result Value Ref Range   TSH 5.1 (H) uU/mL   T4 5.9 ug/dL   Free T-3 3.0 pg/mL   Triiodothyronine (T-3), Serum 101 ng/dL   Testosterone, Serum (Total) 17 ng/dL   Free Testosterone, Serum 2.2 pg/mL   Testosterone-% Free 1.3 %   Follicle Stimulating Hormone 52 mIU/mL   Progesterone, Serum <10 ng/dL   DHEA-Sulfate, LCMS 70 ug/dL   Sex Hormone Binding Globulin 32.4 nmol/L  Estrone Sulfate 44 ng/dL   Estradiol, Serum, MS 5.7 pg/mL  Anemia Profile B  Result Value Ref Range   Total Iron Binding Capacity 321 250 - 450 ug/dL   UIBC 231 118 - 369 ug/dL   Iron 90 27 - 139 ug/dL   Iron Saturation 28 15 - 55 %   Ferritin 54 15 - 150 ng/mL   Vitamin B-12 >2000 (H) 232 - 1245 pg/mL   Folate >20.0 >3.0 ng/mL   WBC 6.2 3.4 - 10.8 x10E3/uL   RBC 5.38 (H) 3.77 - 5.28 x10E6/uL   Hemoglobin 14.5 11.1 - 15.9 g/dL   Hematocrit 46.7 (H) 34.0 - 46.6 %   MCV 87 79 - 97 fL   MCH 27.0 26.6 - 33.0 pg   MCHC 31.0 (L) 31.5 - 35.7 g/dL   RDW 13.8 11.7 - 15.4 %   Platelets 233 150 - 450 x10E3/uL   Neutrophils 65 Not Estab. %   Lymphs 28 Not Estab. %   Monocytes 5 Not Estab. %   Eos 2 Not Estab. %   Basos 0 Not Estab. %   Neutrophils Absolute 4.0 1.4 - 7.0 x10E3/uL   Lymphocytes Absolute 1.8 0.7 - 3.1 x10E3/uL   Monocytes Absolute 0.3 0.1 - 0.9 x10E3/uL   EOS (ABSOLUTE) 0.1 0.0 - 0.4 x10E3/uL   Basophils Absolute 0.0 0.0 - 0.2 x10E3/uL   Immature Granulocytes 0 Not Estab. %   Immature Grans (Abs) 0.0 0.0 - 0.1 x10E3/uL   Retic Ct Pct 1.2 0.6 - 2.6 %  ANA Comprehensive Panel  Result Value Ref Range   dsDNA Ab 1 0 - 9 IU/mL   ENA RNP Ab <0.2 0.0 - 0.9 AI   ENA SM Ab Ser-aCnc <0.2 0.0 - 0.9 AI   Scleroderma (Scl-70) (ENA) Antibody, IgG <0.2 0.0 - 0.9 AI   ENA SSA (RO) Ab <0.2 0.0 - 0.9 AI   ENA SSB (LA) Ab <0.2 0.0 - 0.9 AI   Chromatin Ab SerPl-aCnc <0.2 0.0 - 0.9 AI   Anti JO-1 <0.2 0.0 - 0.9 AI   Centromere Ab Screen <0.2 0.0  - 0.9 AI   See below: Comment      Skin excision  Date/Time: 02/22/2022 1:02 PM  Performed by: Baruch Gouty, FNP Authorized by: Baruch Gouty, FNP   Number of Lesions: 1 Lesion 1:    Body area: upper extremity   Upper extremity location: L index finger   Initial size (mm): 1   Malignancy: malignancy unknown     Destruction method: cryotherapy     Destruction method comment: three freeze thaw cycles    Pertinent labs & imaging results that were available during my care of the patient were reviewed by me and considered in my medical decision making.  Assessment & Plan:  Haneen was seen today for verruca vulgaris.  Diagnoses and all orders for this visit:  Verruca vulgaris Tolerated cryotherapy well. Aftercare discussed in detail.  -     Skin excision     Continue all other maintenance medications.  Follow up plan: Return if symptoms worsen or fail to improve.   Continue healthy lifestyle choices, including diet (rich in fruits, vegetables, and lean proteins, and low in salt and simple carbohydrates) and exercise (at least 30 minutes of moderate physical activity daily).   The above assessment and management plan was discussed with the patient. The patient verbalized understanding of and has agreed to the management plan. Patient is aware to call the clinic  if they develop any new symptoms or if symptoms persist or worsen. Patient is aware when to return to the clinic for a follow-up visit. Patient educated on when it is appropriate to go to the emergency department.   Monia Pouch, FNP-C [facility] [phone]

## 2022-03-21 DIAGNOSIS — M9901 Segmental and somatic dysfunction of cervical region: Secondary | ICD-10-CM | POA: Diagnosis not present

## 2022-03-21 DIAGNOSIS — M9902 Segmental and somatic dysfunction of thoracic region: Secondary | ICD-10-CM | POA: Diagnosis not present

## 2022-03-21 DIAGNOSIS — M62830 Muscle spasm of back: Secondary | ICD-10-CM | POA: Diagnosis not present

## 2022-03-21 DIAGNOSIS — M9903 Segmental and somatic dysfunction of lumbar region: Secondary | ICD-10-CM | POA: Diagnosis not present

## 2022-03-22 DIAGNOSIS — M62830 Muscle spasm of back: Secondary | ICD-10-CM | POA: Diagnosis not present

## 2022-03-22 DIAGNOSIS — M9902 Segmental and somatic dysfunction of thoracic region: Secondary | ICD-10-CM | POA: Diagnosis not present

## 2022-03-22 DIAGNOSIS — M9903 Segmental and somatic dysfunction of lumbar region: Secondary | ICD-10-CM | POA: Diagnosis not present

## 2022-03-22 DIAGNOSIS — M9901 Segmental and somatic dysfunction of cervical region: Secondary | ICD-10-CM | POA: Diagnosis not present

## 2022-03-27 DIAGNOSIS — M62830 Muscle spasm of back: Secondary | ICD-10-CM | POA: Diagnosis not present

## 2022-03-27 DIAGNOSIS — M9903 Segmental and somatic dysfunction of lumbar region: Secondary | ICD-10-CM | POA: Diagnosis not present

## 2022-03-27 DIAGNOSIS — M9901 Segmental and somatic dysfunction of cervical region: Secondary | ICD-10-CM | POA: Diagnosis not present

## 2022-03-27 DIAGNOSIS — M9902 Segmental and somatic dysfunction of thoracic region: Secondary | ICD-10-CM | POA: Diagnosis not present

## 2022-06-05 ENCOUNTER — Ambulatory Visit: Payer: No Typology Code available for payment source | Admitting: Family Medicine

## 2022-06-05 ENCOUNTER — Encounter: Payer: Self-pay | Admitting: Family Medicine

## 2022-06-05 VITALS — BP 139/84 | HR 72 | Temp 97.8°F | Ht 61.0 in | Wt 197.6 lb

## 2022-06-05 DIAGNOSIS — E7800 Pure hypercholesterolemia, unspecified: Secondary | ICD-10-CM | POA: Diagnosis not present

## 2022-06-05 DIAGNOSIS — Z6835 Body mass index (BMI) 35.0-35.9, adult: Secondary | ICD-10-CM | POA: Diagnosis not present

## 2022-06-05 DIAGNOSIS — R6889 Other general symptoms and signs: Secondary | ICD-10-CM | POA: Diagnosis not present

## 2022-06-05 DIAGNOSIS — E559 Vitamin D deficiency, unspecified: Secondary | ICD-10-CM | POA: Insufficient documentation

## 2022-06-05 DIAGNOSIS — E039 Hypothyroidism, unspecified: Secondary | ICD-10-CM | POA: Insufficient documentation

## 2022-06-05 NOTE — Addendum Note (Signed)
Addended by: Ainsley Deakins M on: 06/05/2022 09:48 PM   Modules accepted: Level of Service  

## 2022-06-05 NOTE — Progress Notes (Signed)
Subjective:  Patient ID: Ann Valdez, female    DOB: Jul 13, 1955, 67 y.o.   MRN: [id number]  Patient Care Team: Sonny Masters, FNP as PCP - General (Family Medicine)   Chief Complaint:  Medical Management of Chronic Issues (6 month follow up )   HPI: Ann Valdez is a 67 y.o. female presenting on 06/05/2022 for Medical Management of Chronic Issues (6 month follow up )   Thyroid Problem Presents for follow-up (not taking medications) visit. Patient reports no anxiety, cold intolerance, constipation, depressed mood, diaphoresis, diarrhea, dry skin, fatigue, hair loss, heat intolerance, hoarse voice, leg swelling, menstrual problem, nail problem, palpitations, tremors, visual change, weight gain or weight loss. Her past medical history is significant for hyperlipidemia.  Hyperlipidemia This is a recurrent problem. Recent lipid tests were reviewed and are high. Exacerbating diseases include hypothyroidism and obesity. Pertinent negatives include no chest pain, focal sensory loss, focal weakness, leg pain, myalgias or shortness of breath. Current antihyperlipidemic treatment includes diet change (stopped statin). Compliance problems: medication.  Risk factors for coronary artery disease include dyslipidemia, obesity and post-menopausal.  Vitamin D deficiency Pt is taking oral repletion therapy. Denies bone pain and tenderness, muscle weakness, fracture, and difficulty walking. Lab Results  Component Value Date   VD25OH 50.3 11/29/2021   VD25OH 19.7 (L) 05/25/2021   Lab Results  Component Value Date   CALCIUM 9.5 11/29/2021         Relevant past medical, surgical, family, and social history reviewed and updated as indicated.  Allergies and medications reviewed and updated. Data reviewed: Chart in Epic.   Past Medical History:  Diagnosis Date   Medical history non-contributory     Past Surgical History:  Procedure Laterality Date   COLONOSCOPY N/A 01/11/2021    Procedure: COLONOSCOPY;  Surgeon: Corbin Ade, MD;  Location: [facility];  Service: Endoscopy;  Laterality: N/A;  7:30AM   COSMETIC SURGERY      Social History   Socioeconomic History   Marital status: Married    Spouse name: Not on file   Number of children: 3   Years of education: Not on file   Highest education level: 12th grade  Occupational History   Not on file  Tobacco Use   Smoking status: Never   Smokeless tobacco: Never  Vaping Use   Vaping Use: Never used  Substance and Sexual Activity   Alcohol use: Never   Drug use: Never   Sexual activity: Yes    Birth control/protection: None  Other Topics Concern   Not on file  Social History Narrative   Not on file   Social Determinants of Health   Financial Resource Strain: Low Risk  (12/18/2021)   Overall Financial Resource Strain (CARDIA)    Difficulty of Paying Living Expenses: Not hard at all  Food Insecurity: No Food Insecurity (12/18/2021)   Hunger Vital Sign    Worried About Running Out of Food in the Last Year: Never true    Ran Out of Food in the Last Year: Never true  Transportation Needs: No Transportation Needs (12/13/2020)   PRAPARE - Administrator, Civil Service (Medical): No    Lack of Transportation (Non-Medical): No  Physical Activity: Insufficiently Active (12/13/2020)   Exercise Vital Sign    Days of Exercise per Week: 3 days    Minutes of Exercise per Session: 10 min  Stress: No Stress Concern Present (12/18/2021)   [employer] of Occupational Health -  Occupational Stress Questionnaire    Feeling of Stress : Not at all  Social Connections: Moderately Isolated (12/18/2021)   Social Connection and Isolation Panel [NHANES]    Frequency of Communication with Friends and Family: More than three times a week    Frequency of Social Gatherings with Friends and Family: More than three times a week    Attends Religious Services: Never    Database administrator or Organizations: No     Attends Banker Meetings: Never    Marital Status: Married  Catering manager Violence: Not At Risk (12/18/2021)   Humiliation, Afraid, Rape, and Kick questionnaire    Fear of Current or Ex-Partner: No    Emotionally Abused: No    Physically Abused: No    Sexually Abused: No    Outpatient Encounter Medications as of 06/05/2022  Medication Sig   bisacodyl 5 MG EC tablet Take 1 tablet (5 mg total) by mouth daily as needed for moderate constipation. (Patient taking differently: Take 10 mg by mouth daily at 8 pm.)   [DISCONTINUED] atorvastatin (LIPITOR) 20 MG tablet Take 1 tablet (20 mg total) by mouth daily. (Patient not taking: Reported on 06/05/2022)   [DISCONTINUED] levothyroxine (SYNTHROID) 25 MCG tablet Take 1 tablet (25 mcg total) by mouth daily. (Patient not taking: Reported on 06/05/2022)   No facility-administered encounter medications on file as of 06/05/2022.    Allergies  Allergen Reactions   Ciprofloxacin Rash and Itching   Tetracyclines & Related Other (See Comments)    Review of Systems  Constitutional:  Negative for activity change, appetite change, chills, diaphoresis, fatigue, fever, unexpected weight change, weight gain and weight loss.  HENT:  Negative for hoarse voice.   Eyes:  Negative for photophobia and visual disturbance.  Respiratory:  Negative for shortness of breath.   Cardiovascular:  Negative for chest pain and palpitations.  Gastrointestinal:  Negative for abdominal pain, constipation and diarrhea.  Endocrine: Negative for cold intolerance and heat intolerance.  Genitourinary:  Negative for decreased urine volume, difficulty urinating and menstrual problem.  Musculoskeletal:  Negative for myalgias.  Neurological:  Negative for dizziness, tremors, focal weakness and headaches.  Psychiatric/Behavioral:  Negative for agitation, confusion and sleep disturbance. The patient is not nervous/anxious.   All other systems reviewed and are  negative.       Objective:  BP 139/84   Pulse 72   Temp 97.8 F (36.6 C) (Temporal)   Ht 5\' 1"  (1.549 m)   Wt 197 lb 9.6 oz (89.6 kg)   SpO2 95%   BMI 37.34 kg/m    Wt Readings from Last 3 Encounters:  06/05/22 197 lb 9.6 oz (89.6 kg)  02/22/22 207 lb 6.4 oz (94.1 kg)  01/25/22 204 lb (92.5 kg)    Physical Exam Vitals and nursing note reviewed.  Constitutional:      General: She is not in acute distress.    Appearance: Normal appearance. She is well-developed and well-groomed. She is obese. She is not ill-appearing, toxic-appearing or diaphoretic.  HENT:     Head: Normocephalic and atraumatic.     Jaw: There is normal jaw occlusion.     Right Ear: Hearing normal.     Left Ear: Hearing normal.     Nose: Nose normal.     Mouth/Throat:     Lips: Pink.     Mouth: Mucous membranes are moist.     Pharynx: Oropharynx is clear. Uvula midline.  Eyes:     General: Lids are  normal.     Extraocular Movements: Extraocular movements intact.     Conjunctiva/sclera: Conjunctivae normal.     Pupils: Pupils are equal, round, and reactive to light.  Neck:     Trachea: Trachea and phonation normal.  Cardiovascular:     Rate and Rhythm: Normal rate and regular rhythm.     Chest Wall: PMI is not displaced.     Pulses: Normal pulses.     Heart sounds: Normal heart sounds. No murmur heard.    No friction rub. No gallop.  Pulmonary:     Effort: Pulmonary effort is normal. No respiratory distress.     Breath sounds: Normal breath sounds. No wheezing.  Abdominal:     General: There is no abdominal bruit.     Palpations: There is no hepatomegaly or splenomegaly.  Musculoskeletal:     Cervical back: Normal range of motion and neck supple.     Right lower leg: No edema.     Left lower leg: No edema.  Skin:    General: Skin is warm and dry.     Capillary Refill: Capillary refill takes less than 2 seconds.     Coloration: Skin is not cyanotic, jaundiced or pale.     Findings: No rash.   Neurological:     General: No focal deficit present.     Mental Status: She is alert and oriented to person, place, and time.     Sensory: Sensation is intact.     Motor: Motor function is intact.     Coordination: Coordination is intact.     Gait: Gait is intact.     Deep Tendon Reflexes: Reflexes are normal and symmetric.  Psychiatric:        Attention and Perception: Attention and perception normal.        Mood and Affect: Mood and affect normal.        Speech: Speech normal.        Behavior: Behavior normal. Behavior is cooperative.        Thought Content: Thought content normal.        Cognition and Memory: Cognition and memory normal.        Judgment: Judgment normal.     Results for orders placed or performed in visit on 12/27/21  Hormone Panel  Result Value Ref Range   TSH 5.1 (H) uU/mL   T4 5.9 ug/dL   Free T-3 3.0 pg/mL   Triiodothyronine (T-3), Serum 101 ng/dL   Testosterone, Serum (Total) 17 ng/dL   Free Testosterone, Serum 2.2 pg/mL   Testosterone-% Free 1.3 %   Follicle Stimulating Hormone 52 mIU/mL   Progesterone, Serum <10 ng/dL   DHEA-Sulfate, LCMS 70 ug/dL   Sex Hormone Binding Globulin 32.4 nmol/L   Estrone Sulfate 44 ng/dL   Estradiol, Serum, MS 5.7 pg/mL  Anemia Profile B  Result Value Ref Range   Total Iron Binding Capacity 321 250 - 450 ug/dL   UIBC 161 096 - 045 ug/dL   Iron 90 27 - 409 ug/dL   Iron Saturation 28 15 - 55 %   Ferritin 54 15 - 150 ng/mL   Vitamin B-12 >2000 (H) 232 - 1245 pg/mL   Folate >20.0 >3.0 ng/mL   WBC 6.2 3.4 - 10.8 x10E3/uL   RBC 5.38 (H) 3.77 - 5.28 x10E6/uL   Hemoglobin 14.5 11.1 - 15.9 g/dL   Hematocrit 81.1 (H) 91.4 - 46.6 %   MCV 87 79 - 97 fL   MCH 27.0 26.6 -  33.0 pg   MCHC 31.0 (L) 31.5 - 35.7 g/dL   RDW 14.7 82.9 - 56.2 %   Platelets 233 150 - 450 x10E3/uL   Neutrophils 65 Not Estab. %   Lymphs 28 Not Estab. %   Monocytes 5 Not Estab. %   Eos 2 Not Estab. %   Basos 0 Not Estab. %   Neutrophils  Absolute 4.0 1.4 - 7.0 x10E3/uL   Lymphocytes Absolute 1.8 0.7 - 3.1 x10E3/uL   Monocytes Absolute 0.3 0.1 - 0.9 x10E3/uL   EOS (ABSOLUTE) 0.1 0.0 - 0.4 x10E3/uL   Basophils Absolute 0.0 0.0 - 0.2 x10E3/uL   Immature Granulocytes 0 Not Estab. %   Immature Grans (Abs) 0.0 0.0 - 0.1 x10E3/uL   Retic Ct Pct 1.2 0.6 - 2.6 %  ANA Comprehensive Panel  Result Value Ref Range   dsDNA Ab 1 0 - 9 IU/mL   ENA RNP Ab <0.2 0.0 - 0.9 AI   ENA SM Ab Ser-aCnc <0.2 0.0 - 0.9 AI   Scleroderma (Scl-70) (ENA) Antibody, IgG <0.2 0.0 - 0.9 AI   ENA SSA (RO) Ab <0.2 0.0 - 0.9 AI   ENA SSB (LA) Ab <0.2 0.0 - 0.9 AI   Chromatin Ab SerPl-aCnc <0.2 0.0 - 0.9 AI   Anti JO-1 <0.2 0.0 - 0.9 AI   Centromere Ab Screen <0.2 0.0 - 0.9 AI   See below: Comment        Pertinent labs & imaging results that were available during my care of the patient were reviewed by me and considered in my medical decision making.  Assessment & Plan:  Elexcia was seen today for medical management of chronic issues.  Diagnoses and all orders for this visit:  Acquired hypothyroidism Pt is not taking medications as she feels she does not need them. Denies hyper- or hypothyroid symptoms. Will recheck labs today and readdress if repletion therapy is needed.  -     Thyroid Panel With TSH  Pure hypercholesterolemia Not taking medications. States she is eating better. Will repeat labs today. Diet and exercise encouraged.  -     CMP14+EGFR -     Lipid panel  BMI 35.0-35.9,adult Diet and exercise encouraged. Labs pending,  -     Anemia Profile B -     CMP14+EGFR -     Lipid panel -     Thyroid Panel With TSH -     VITAMIN D 25 Hydroxy (Vit-D Deficiency, Fractures)  Vitamin D deficiency Labs pending. Continue repletion therapy. If indicated, will change repletion dosage. Eat foods rich in Vit D including milk, orange juice, yogurt with vitamin D added, salmon or mackerel, canned tuna fish, cereals with vitamin D added, and cod liver  oil. Get out in the sun but make sure to wear at least SPF 30 sunscreen.  -     VITAMIN D 25 Hydroxy (Vit-D Deficiency, Fractures)     Continue all other maintenance medications.  Follow up plan: Return in about 1 year (around 06/05/2023), or if symptoms worsen or fail to improve, for CPE.   Continue healthy lifestyle choices, including diet (rich in fruits, vegetables, and lean proteins, and low in salt and simple carbohydrates) and exercise (at least 30 minutes of moderate physical activity daily).   The above assessment and management plan was discussed with the patient. The patient verbalized understanding of and has agreed to the management plan. Patient is aware to call the clinic if they develop any new symptoms  or if symptoms persist or worsen. Patient is aware when to return to the clinic for a follow-up visit. Patient educated on when it is appropriate to go to the emergency department.   Kari Baars, FNP-C [facility] [phone]

## 2022-06-06 LAB — VITAMIN D 25 HYDROXY (VIT D DEFICIENCY, FRACTURES): Vit D, 25-Hydroxy: 39.3 ng/mL (ref 30.0–100.0)

## 2022-06-06 LAB — LIPID PANEL
Chol/HDL Ratio: 3.4 ratio (ref 0.0–4.4)
Cholesterol, Total: 255 mg/dL — ABNORMAL HIGH (ref 100–199)
HDL: 75 mg/dL (ref >39)
LDL Chol Calc (NIH): 166 mg/dL — ABNORMAL HIGH (ref 0–99)
Triglycerides: 85 mg/dL (ref 0–149)
VLDL Cholesterol Cal: 14 mg/dL (ref 5–40)

## 2022-06-06 LAB — ANEMIA PROFILE B
Basophils Absolute: 0 10*3/uL (ref 0.0–0.2)
Basos: 1 %
EOS (ABSOLUTE): 0.1 10*3/uL (ref 0.0–0.4)
Eos: 2 %
Ferritin: 58 ng/mL (ref 15–150)
Folate: >20 ng/mL (ref >3.0)
Hematocrit: 45.1 % (ref 34.0–46.6)
Hemoglobin: 14.7 g/dL (ref 11.1–15.9)
Immature Grans (Abs): 0 10*3/uL (ref 0.0–0.1)
Immature Granulocytes: 0 %
Iron Saturation: 34 % (ref 15–55)
Iron: 116 ug/dL (ref 27–139)
Lymphocytes Absolute: 1.8 10*3/uL (ref 0.7–3.1)
Lymphs: 32 %
MCH: 27 pg (ref 26.6–33.0)
MCHC: 32.6 g/dL (ref 31.5–35.7)
MCV: 83 fL (ref 79–97)
Monocytes Absolute: 0.4 10*3/uL (ref 0.1–0.9)
Monocytes: 7 %
Neutrophils Absolute: 3.3 10*3/uL (ref 1.4–7.0)
Neutrophils: 58 %
Platelets: 227 10*3/uL (ref 150–450)
RBC: 5.44 x10E6/uL — ABNORMAL HIGH (ref 3.77–5.28)
RDW: 13.3 % (ref 11.7–15.4)
Retic Ct Pct: 1.2 % (ref 0.6–2.6)
Total Iron Binding Capacity: 342 ug/dL (ref 250–450)
UIBC: 226 ug/dL (ref 118–369)
Vitamin B-12: 1600 pg/mL — ABNORMAL HIGH (ref 232–1245)
WBC: 5.6 10*3/uL (ref 3.4–10.8)

## 2022-06-06 LAB — CMP14+EGFR
ALT: 25 IU/L (ref 0–32)
AST: 20 IU/L (ref 0–40)
Albumin/Globulin Ratio: 1.9 (ref 1.2–2.2)
Albumin: 4.3 g/dL (ref 3.9–4.9)
Alkaline Phosphatase: 67 IU/L (ref 44–121)
BUN/Creatinine Ratio: 20 (ref 12–28)
BUN: 17 mg/dL (ref 8–27)
Bilirubin Total: 0.4 mg/dL (ref 0.0–1.2)
CO2: 19 mmol/L — ABNORMAL LOW (ref 20–29)
Calcium: 9.4 mg/dL (ref 8.7–10.3)
Chloride: 108 mmol/L — ABNORMAL HIGH (ref 96–106)
Creatinine, Ser: 0.84 mg/dL (ref 0.57–1.00)
Globulin, Total: 2.3 g/dL (ref 1.5–4.5)
Glucose: 112 mg/dL — ABNORMAL HIGH (ref 70–99)
Potassium: 4.4 mmol/L (ref 3.5–5.2)
Sodium: 142 mmol/L (ref 134–144)
Total Protein: 6.6 g/dL (ref 6.0–8.5)
eGFR: 77 mL/min/{1.73_m2} (ref >59)

## 2022-06-06 LAB — THYROID PANEL WITH TSH
Free Thyroxine Index: 2 (ref 1.2–4.9)
T3 Uptake Ratio: 28 % (ref 24–39)
T4, Total: 7 ug/dL (ref 4.5–12.0)
TSH: 4.52 u[IU]/mL — ABNORMAL HIGH (ref 0.450–4.500)

## 2022-10-09 ENCOUNTER — Encounter: Payer: Self-pay | Admitting: Family Medicine

## 2023-01-30 ENCOUNTER — Telehealth: Payer: Self-pay | Admitting: Family Medicine

## 2023-01-30 NOTE — Telephone Encounter (Signed)
 Please Review

## 2023-02-06 ENCOUNTER — Encounter: Payer: Self-pay | Admitting: Family Medicine

## 2023-02-06 ENCOUNTER — Ambulatory Visit: Payer: No Typology Code available for payment source | Admitting: Family Medicine

## 2023-02-06 VITALS — BP 146/77 | HR 81 | Temp 97.2°F | Ht 61.0 in | Wt 196.0 lb

## 2023-02-06 DIAGNOSIS — L659 Nonscarring hair loss, unspecified: Secondary | ICD-10-CM | POA: Diagnosis not present

## 2023-02-06 NOTE — Progress Notes (Signed)
Subjective:  Patient ID: Ann Valdez, female    DOB: 1955-05-23, 68 y.o.   MRN: [id number]  Patient Care Team: Sonny Masters, FNP as PCP - General (Family Medicine)   Chief Complaint:  Referral (Derm )   HPI: Ann Valdez is a 68 y.o. female presenting on 02/06/2023 for Referral (Derm )   Discussed the use of AI scribe software for clinical note transcription with the patient, who gave verbal consent to proceed.  History of Present Illness   The patient, with a history of hair loss, reports experiencing this issue since before Thanksgiving. The hair loss was significant enough that she decided to shave her head during the holiday. She describes the presence of balding spots that were notably itchy. In an attempt to manage the condition, she has been applying castor oil and fluconazole, which has resulted in some improvement and good hair regrowth. However, she notes that her hair used to be much longer before the onset of this issue. Despite the progress made with self-care measures, the patient desires a referral to dermatology for further evaluation and management of her hair loss. Prior workup for this issue, including vitamin levels, has been normal.          Relevant past medical, surgical, family, and social history reviewed and updated as indicated.  Allergies and medications reviewed and updated. Data reviewed: Chart in Epic.   Past Medical History:  Diagnosis Date   Medical history non-contributory     Past Surgical History:  Procedure Laterality Date   COLONOSCOPY N/A 01/11/2021   Procedure: COLONOSCOPY;  Surgeon: Corbin Ade, MD;  Location: [facility];  Service: Endoscopy;  Laterality: N/A;  7:30AM   COSMETIC SURGERY      Social History   Socioeconomic History   Marital status: Married    Spouse name: Not on file   Number of children: 3   Years of education: Not on file   Highest education level: 12th grade  Occupational History   Not  on file  Tobacco Use   Smoking status: Never   Smokeless tobacco: Never  Vaping Use   Vaping status: Never Used  Substance and Sexual Activity   Alcohol use: Never   Drug use: Never   Sexual activity: Yes    Birth control/protection: None  Other Topics Concern   Not on file  Social History Narrative   Not on file   Social Drivers of Health   Financial Resource Strain: Low Risk  (12/18/2021)   Overall Financial Resource Strain (CARDIA)    Difficulty of Paying Living Expenses: Not hard at all  Food Insecurity: No Food Insecurity (12/18/2021)   Hunger Vital Sign    Worried About Running Out of Food in the Last Year: Never true    Ran Out of Food in the Last Year: Never true  Transportation Needs: No Transportation Needs (12/13/2020)   PRAPARE - Administrator, Civil Service (Medical): No    Lack of Transportation (Non-Medical): No  Physical Activity: Insufficiently Active (12/13/2020)   Exercise Vital Sign    Days of Exercise per Week: 3 days    Minutes of Exercise per Session: 10 min  Stress: No Stress Concern Present (12/18/2021)   [employer] of Occupational Health - Occupational Stress Questionnaire    Feeling of Stress : Not at all  Social Connections: Moderately Isolated (12/18/2021)   Social Connection and Isolation Panel [NHANES]    Frequency of Communication  with Friends and Family: More than three times a week    Frequency of Social Gatherings with Friends and Family: More than three times a week    Attends Religious Services: Never    Database administrator or Organizations: No    Attends Banker Meetings: Never    Marital Status: Married  Catering manager Violence: Not At Risk (12/18/2021)   Humiliation, Afraid, Rape, and Kick questionnaire    Fear of Current or Ex-Partner: No    Emotionally Abused: No    Physically Abused: No    Sexually Abused: No    Outpatient Encounter Medications as of 02/06/2023  Medication Sig   bisacodyl 5  MG EC tablet Take 1 tablet (5 mg total) by mouth daily as needed for moderate constipation. (Patient taking differently: Take 10 mg by mouth daily at 8 pm.)   No facility-administered encounter medications on file as of 02/06/2023.    Allergies  Allergen Reactions   Ciprofloxacin Rash and Itching   Tetracyclines & Related Other (See Comments)    Pertinent ROS per HPI, otherwise unremarkable      Objective:  BP (!) 146/77   Pulse 81   Temp (!) 97.2 F (36.2 C)   Ht 5\' 1"  (1.549 m)   Wt 196 lb (88.9 kg)   BMI 37.03 kg/m    Wt Readings from Last 3 Encounters:  02/06/23 196 lb (88.9 kg)  06/05/22 197 lb 9.6 oz (89.6 kg)  02/22/22 207 lb 6.4 oz (94.1 kg)    Physical Exam Vitals and nursing note reviewed.  Constitutional:      Appearance: Normal appearance. She is obese.  HENT:     Head: Normocephalic and atraumatic.     Nose: Nose normal.     Mouth/Throat:     Mouth: Mucous membranes are moist.  Eyes:     Pupils: Pupils are equal, round, and reactive to light.  Cardiovascular:     Rate and Rhythm: Normal rate and regular rhythm.     Heart sounds: Normal heart sounds.  Pulmonary:     Effort: Pulmonary effort is normal.     Breath sounds: Normal breath sounds.  Musculoskeletal:     Cervical back: Neck supple.     Right lower leg: No edema.     Left lower leg: No edema.  Skin:    General: Skin is warm and dry.     Capillary Refill: Capillary refill takes less than 2 seconds.     Comments: Hair regrowing. No scalp abnormalities noted.   Neurological:     General: No focal deficit present.     Mental Status: She is alert and oriented to person, place, and time.  Psychiatric:        Mood and Affect: Mood normal.        Behavior: Behavior normal.        Thought Content: Thought content normal.        Judgment: Judgment normal.      Results for orders placed or performed in visit on 06/05/22  Anemia Profile B   Collection Time: 06/05/22 11:25 AM  Result Value  Ref Range   Total Iron Binding Capacity 342 250 - 450 ug/dL   UIBC 562 130 - 865 ug/dL   Iron 784 27 - 696 ug/dL   Iron Saturation 34 15 - 55 %   Ferritin 58 15 - 150 ng/mL   Vitamin B-12 1,600 (H) 232 - 1,245 pg/mL   Folate >20.0 >  3.0 ng/mL   WBC 5.6 3.4 - 10.8 x10E3/uL   RBC 5.44 (H) 3.77 - 5.28 x10E6/uL   Hemoglobin 14.7 11.1 - 15.9 g/dL   Hematocrit 16.1 09.6 - 46.6 %   MCV 83 79 - 97 fL   MCH 27.0 26.6 - 33.0 pg   MCHC 32.6 31.5 - 35.7 g/dL   RDW 04.5 40.9 - 81.1 %   Platelets 227 150 - 450 x10E3/uL   Neutrophils 58 Not Estab. %   Lymphs 32 Not Estab. %   Monocytes 7 Not Estab. %   Eos 2 Not Estab. %   Basos 1 Not Estab. %   Neutrophils Absolute 3.3 1.4 - 7.0 x10E3/uL   Lymphocytes Absolute 1.8 0.7 - 3.1 x10E3/uL   Monocytes Absolute 0.4 0.1 - 0.9 x10E3/uL   EOS (ABSOLUTE) 0.1 0.0 - 0.4 x10E3/uL   Basophils Absolute 0.0 0.0 - 0.2 x10E3/uL   Immature Granulocytes 0 Not Estab. %   Immature Grans (Abs) 0.0 0.0 - 0.1 x10E3/uL   Retic Ct Pct 1.2 0.6 - 2.6 %  CMP14+EGFR   Collection Time: 06/05/22 11:25 AM  Result Value Ref Range   Glucose 112 (H) 70 - 99 mg/dL   BUN 17 8 - 27 mg/dL   Creatinine, Ser 9.14 0.57 - 1.00 mg/dL   eGFR 77 >78 GN/FAO/1.30   BUN/Creatinine Ratio 20 12 - 28   Sodium 142 134 - 144 mmol/L   Potassium 4.4 3.5 - 5.2 mmol/L   Chloride 108 (H) 96 - 106 mmol/L   CO2 19 (L) 20 - 29 mmol/L   Calcium 9.4 8.7 - 10.3 mg/dL   Total Protein 6.6 6.0 - 8.5 g/dL   Albumin 4.3 3.9 - 4.9 g/dL   Globulin, Total 2.3 1.5 - 4.5 g/dL   Albumin/Globulin Ratio 1.9 1.2 - 2.2   Bilirubin Total 0.4 0.0 - 1.2 mg/dL   Alkaline Phosphatase 67 44 - 121 IU/L   AST 20 0 - 40 IU/L   ALT 25 0 - 32 IU/L  Lipid panel   Collection Time: 06/05/22 11:25 AM  Result Value Ref Range   Cholesterol, Total 255 (H) 100 - 199 mg/dL   Triglycerides 85 0 - 149 mg/dL   HDL 75 >86 mg/dL   VLDL Cholesterol Cal 14 5 - 40 mg/dL   LDL Chol Calc (NIH) 578 (H) 0 - 99 mg/dL   Chol/HDL Ratio 3.4  0.0 - 4.4 ratio  Thyroid Panel With TSH   Collection Time: 06/05/22 11:25 AM  Result Value Ref Range   TSH 4.520 (H) 0.450 - 4.500 uIU/mL   T4, Total 7.0 4.5 - 12.0 ug/dL   T3 Uptake Ratio 28 24 - 39 %   Free Thyroxine Index 2.0 1.2 - 4.9  VITAMIN D 25 Hydroxy (Vit-D Deficiency, Fractures)   Collection Time: 06/05/22 11:25 AM  Result Value Ref Range   Vit D, 25-Hydroxy 39.3 30.0 - 100.0 ng/mL       Pertinent labs & imaging results that were available during my care of the patient were reviewed by me and considered in my medical decision making.  Assessment & Plan:  Carlette was seen today for referral.  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 Shaved head on Thanksgiving and has been using castor oil and fluconazole with some improvement. Reports itchy balding  spots. Previous workup including vitamin levels was normal. Patient requests dermatology referral for further evaluation and management. Discussed potential treatments such as topical steroids or other medications, noting variable outcomes. - Refer to dermatology for further evaluation and management.          Continue all other maintenance medications.  Follow up plan: Return if symptoms worsen or fail to improve.   Continue healthy lifestyle choices, including diet (rich in fruits, vegetables, and lean proteins, and low in salt and simple carbohydrates) and exercise (at least 30 minutes of moderate physical activity daily).   The above assessment and management plan was discussed with the patient. The patient verbalized understanding of and has agreed to the management plan. Patient is aware to call the clinic if they develop any new symptoms or if symptoms persist or worsen. Patient is aware when to return to the clinic for a follow-up visit. Patient educated on when it is  appropriate to go to the emergency department.   Kari Baars, FNP-C [facility] [phone]

## 2023-02-08 ENCOUNTER — Ambulatory Visit: Payer: No Typology Code available for payment source | Admitting: Family Medicine

## 2023-02-11 ENCOUNTER — Telehealth: Payer: Self-pay | Admitting: Family Medicine

## 2023-02-11 NOTE — Telephone Encounter (Signed)
Copied from CRM [id number]. Topic: Referral - Status >> Feb 11, 2023  3:57 PM Monisha R wrote: Reason for CRM: Patient wants to make a appointment asap for Dermatologist that was referred

## 2023-02-20 DIAGNOSIS — H02834 Dermatochalasis of left upper eyelid: Secondary | ICD-10-CM | POA: Diagnosis not present

## 2023-02-20 DIAGNOSIS — H524 Presbyopia: Secondary | ICD-10-CM | POA: Diagnosis not present

## 2023-02-20 DIAGNOSIS — H52223 Regular astigmatism, bilateral: Secondary | ICD-10-CM | POA: Diagnosis not present

## 2023-02-20 DIAGNOSIS — H02831 Dermatochalasis of right upper eyelid: Secondary | ICD-10-CM | POA: Diagnosis not present

## 2023-03-12 DIAGNOSIS — H401121 Primary open-angle glaucoma, left eye, mild stage: Secondary | ICD-10-CM | POA: Diagnosis not present

## 2023-03-12 DIAGNOSIS — H40051 Ocular hypertension, right eye: Secondary | ICD-10-CM | POA: Diagnosis not present

## 2023-04-11 DIAGNOSIS — L6689 Other cicatricial alopecia: Secondary | ICD-10-CM | POA: Diagnosis not present

## 2023-05-07 DIAGNOSIS — H401121 Primary open-angle glaucoma, left eye, mild stage: Secondary | ICD-10-CM | POA: Diagnosis not present

## 2023-05-07 DIAGNOSIS — H2513 Age-related nuclear cataract, bilateral: Secondary | ICD-10-CM | POA: Diagnosis not present

## 2023-05-22 ENCOUNTER — Other Ambulatory Visit: Payer: Self-pay | Admitting: Family Medicine

## 2023-05-22 ENCOUNTER — Telehealth: Payer: Self-pay

## 2023-05-22 DIAGNOSIS — Z8619 Personal history of other infectious and parasitic diseases: Secondary | ICD-10-CM

## 2023-05-22 MED ORDER — VALACYCLOVIR HCL 1 G PO TABS: 1000.0000 mg | ORAL_TABLET | Freq: Two times a day (BID) | ORAL | 0 refills | Status: AC

## 2023-05-22 NOTE — Telephone Encounter (Signed)
 Copied from CRM [id number]. Topic: Clinical - Medication Question >> May 22, 2023 12:55 PM Bridgette Campus T wrote: Reason for CRM: going to have lips tattooed and need Valtrex called in to [facility] in [phone]

## 2023-05-22 NOTE — Telephone Encounter (Signed)
 [email or web address]

## 2023-05-27 NOTE — Telephone Encounter (Signed)
 Left detailed message - requested rx sent

## 2023-06-05 ENCOUNTER — Encounter: Payer: Self-pay | Admitting: Family Medicine

## 2023-06-05 ENCOUNTER — Ambulatory Visit: Payer: No Typology Code available for payment source | Admitting: Family Medicine

## 2023-06-05 VITALS — BP 138/78 | HR 81 | Temp 98.0°F | Ht 61.0 in | Wt 197.2 lb

## 2023-06-05 DIAGNOSIS — E7800 Pure hypercholesterolemia, unspecified: Secondary | ICD-10-CM | POA: Diagnosis not present

## 2023-06-05 DIAGNOSIS — E559 Vitamin D deficiency, unspecified: Secondary | ICD-10-CM

## 2023-06-05 DIAGNOSIS — E039 Hypothyroidism, unspecified: Secondary | ICD-10-CM

## 2023-06-05 DIAGNOSIS — L299 Pruritus, unspecified: Secondary | ICD-10-CM | POA: Diagnosis not present

## 2023-06-05 DIAGNOSIS — L659 Nonscarring hair loss, unspecified: Secondary | ICD-10-CM

## 2023-06-05 DIAGNOSIS — Z0001 Encounter for general adult medical examination with abnormal findings: Secondary | ICD-10-CM

## 2023-06-05 DIAGNOSIS — Z1159 Encounter for screening for other viral diseases: Secondary | ICD-10-CM | POA: Diagnosis not present

## 2023-06-05 DIAGNOSIS — Z0000 Encounter for general adult medical examination without abnormal findings: Secondary | ICD-10-CM

## 2023-06-05 DIAGNOSIS — H4089 Other specified glaucoma: Secondary | ICD-10-CM

## 2023-06-05 DIAGNOSIS — H409 Unspecified glaucoma: Secondary | ICD-10-CM | POA: Insufficient documentation

## 2023-06-05 MED ORDER — KETOCONAZOLE 2 % EX SHAM
1.0000 | MEDICATED_SHAMPOO | CUTANEOUS | 0 refills | Status: AC
Start: 2023-06-06 — End: ?

## 2023-06-05 NOTE — Progress Notes (Signed)
 Complete physical exam  Patient: Ann Valdez   DOB: 1955-04-23   68 y.o. Female  MRN: [id number]  Subjective:     Chief Complaint  Patient presents with   Annual Exam    Ann Valdez is a 68 y.o. female who presents today for a complete physical exam. She reports consuming a general diet. The patient does not participate in regular exercise at present. She generally feels well. She reports sleeping well. She does have additional problems to discuss today.   She complains of hair loss and scalp pruritus.   Most recent fall risk assessment:    06/05/2023   10:11 AM  Fall Risk   Falls in the past year? 0  Number falls in past yr: 0  Injury with Fall? 0  Risk for fall due to : No Fall Risks  Follow up Falls evaluation completed     Most recent depression screenings:    06/05/2023   10:11 AM 06/05/2022   10:43 AM  PHQ 2/9 Scores  PHQ - 2 Score 0 0  PHQ- 9 Score 0 0    Vision:Within last year and Dental: No current dental problems and Receives regular dental care  Patient Active Problem List   Diagnosis Date Noted   Glaucoma 06/05/2023   Hypothyroidism 06/05/2022   Vitamin D  deficiency 06/05/2022   Pure hypercholesterolemia 02/21/2021   Constipation 12/28/2020   BMI 35.0-35.9,adult 08/24/2020   Postmenopausal 08/24/2020   Past Medical History:  Diagnosis Date   Medical history non-contributory    Past Surgical History:  Procedure Laterality Date   COLONOSCOPY N/A 01/11/2021   Procedure: COLONOSCOPY;  Surgeon: Suzette Espy, MD;  Location: [facility];  Service: Endoscopy;  Laterality: N/A;  7:30AM   COSMETIC SURGERY     Social History   Tobacco Use   Smoking status: Never   Smokeless tobacco: Never  Vaping Use   Vaping status: Never Used  Substance Use Topics   Alcohol use: Never   Drug use: Never   Social History   Socioeconomic History   Marital status: Married    Spouse name: Not on file   Number of children: 3   Years of  education: Not on file   Highest education level: 12th grade  Occupational History   Not on file  Tobacco Use   Smoking status: Never   Smokeless tobacco: Never  Vaping Use   Vaping status: Never Used  Substance and Sexual Activity   Alcohol use: Never   Drug use: Never   Sexual activity: Yes    Birth control/protection: None  Other Topics Concern   Not on file  Social History Narrative   Not on file   Social Drivers of Health   Financial Resource Strain: Low Risk  (12/18/2021)   Overall Financial Resource Strain (CARDIA)    Difficulty of Paying Living Expenses: Not hard at all  Food Insecurity: No Food Insecurity (12/18/2021)   Hunger Vital Sign    Worried About Running Out of Food in the Last Year: Never true    Ran Out of Food in the Last Year: Never true  Transportation Needs: No Transportation Needs (12/13/2020)   PRAPARE - Administrator, Civil Service (Medical): No    Lack of Transportation (Non-Medical): No  Physical Activity: Insufficiently Active (12/13/2020)   Exercise Vital Sign    Days of Exercise per Week: 3 days    Minutes of Exercise per Session: 10 min  Stress: No  Stress Concern Present (12/18/2021)   [employer] of Occupational Health - Occupational Stress Questionnaire    Feeling of Stress : Not at all  Social Connections: Moderately Isolated (12/18/2021)   Social Connection and Isolation Panel [NHANES]    Frequency of Communication with Friends and Family: More than three times a week    Frequency of Social Gatherings with Friends and Family: More than three times a week    Attends Religious Services: Never    Database administrator or Organizations: No    Attends Banker Meetings: Never    Marital Status: Married  Catering manager Violence: Not At Risk (12/18/2021)   Humiliation, Afraid, Rape, and Kick questionnaire    Fear of Current or Ex-Partner: No    Emotionally Abused: No    Physically Abused: No    Sexually  Abused: No   Family Status  Relation Name Status   Mother  Deceased   Father  Deceased   Sister  Alive   Sister  Alive   Sister  Alive   Sister  Alive   Son  Alive   Son  Alive   Son  Alive   Neg Hx  (Not Specified)  No partnership data on file   Family History  Problem Relation Age of Onset   Colon cancer Neg Hx    Colon polyps Neg Hx    Allergies  Allergen Reactions   Ciprofloxacin Rash and Itching   Tetracyclines & Related Other (See Comments)      Patient Care Team: Galvin Jules, FNP as PCP - General (Family Medicine)   Outpatient Medications Prior to Visit  Medication Sig   bisacodyl  5 MG EC tablet Take 1 tablet (5 mg total) by mouth daily as needed for moderate constipation. (Patient taking differently: Take 10 mg by mouth daily at 8 pm.)   latanoprost (XALATAN) 0.005 % ophthalmic solution 1 drop at bedtime.   No facility-administered medications prior to visit.    ROS per HPI      Objective:     BP 138/78 (BP Location: Left Arm, Cuff Size: Normal)   Pulse 81   Temp 98 F (36.7 C)   Ht 5\' 1"  (1.549 m)   Wt 197 lb 3.2 oz (89.4 kg)   SpO2 93%   BMI 37.26 kg/m  BP Readings from Last 3 Encounters:  06/05/23 138/78  02/06/23 (!) 146/77  06/05/22 139/84   Wt Readings from Last 3 Encounters:  06/05/23 197 lb 3.2 oz (89.4 kg)  02/06/23 196 lb (88.9 kg)  06/05/22 197 lb 9.6 oz (89.6 kg)   SpO2 Readings from Last 3 Encounters:  06/05/23 93%  06/05/22 95%  02/22/22 95%      Physical Exam Vitals and nursing note reviewed.  Constitutional:      General: She is not in acute distress.    Appearance: Normal appearance. She is well-developed and well-groomed. She is obese. She is not ill-appearing, toxic-appearing or diaphoretic.  HENT:     Head: Normocephalic and atraumatic.     Jaw: There is normal jaw occlusion.     Right Ear: Hearing, tympanic membrane, ear canal and external ear normal.     Left Ear: Hearing, tympanic membrane, ear canal  and external ear normal.     Nose: Nose normal.     Mouth/Throat:     Lips: Pink.     Mouth: Mucous membranes are moist.     Pharynx: Oropharynx is clear. Uvula midline.  Eyes:     General: Lids are normal.     Extraocular Movements: Extraocular movements intact.     Conjunctiva/sclera: Conjunctivae normal.     Pupils: Pupils are equal, round, and reactive to light.  Neck:     Thyroid : No thyroid  mass, thyromegaly or thyroid  tenderness.     Vascular: No carotid bruit or JVD.     Trachea: Trachea and phonation normal.  Cardiovascular:     Rate and Rhythm: Normal rate and regular rhythm.     Chest Wall: PMI is not displaced.     Pulses: Normal pulses.     Heart sounds: Normal heart sounds. No murmur heard.    No friction rub. No gallop.  Pulmonary:     Effort: Pulmonary effort is normal. No respiratory distress.     Breath sounds: Normal breath sounds. No wheezing.  Abdominal:     General: Bowel sounds are normal. There is no distension or abdominal bruit.     Palpations: Abdomen is soft. There is no hepatomegaly or splenomegaly.     Tenderness: There is no abdominal tenderness. There is no right CVA tenderness or left CVA tenderness.     Hernia: No hernia is present.  Musculoskeletal:        General: Normal range of motion.     Cervical back: Normal range of motion and neck supple.     Right lower leg: No edema.     Left lower leg: No edema.  Lymphadenopathy:     Cervical: No cervical adenopathy.  Skin:    General: Skin is warm and dry.     Capillary Refill: Capillary refill takes less than 2 seconds.     Coloration: Skin is not cyanotic, jaundiced or pale.     Findings: No rash.  Neurological:     General: No focal deficit present.     Mental Status: She is alert and oriented to person, place, and time.     Sensory: Sensation is intact.     Motor: Motor function is intact.     Coordination: Coordination is intact.     Gait: Gait is intact.     Deep Tendon Reflexes:  Reflexes are normal and symmetric.  Psychiatric:        Attention and Perception: Attention and perception normal.        Mood and Affect: Mood and affect normal.        Speech: Speech normal.        Behavior: Behavior normal. Behavior is cooperative.        Thought Content: Thought content normal.        Cognition and Memory: Cognition and memory normal.        Judgment: Judgment normal.      No results found for any visits on 06/05/23.     Assessment & Plan:    Routine Health Maintenance and Physical Exam  Immunization History  Administered Date(s) Administered   PFIZER(Purple Top)SARS-COV-2 Vaccination 09/18/2019, 10/09/2019    Health Maintenance  Topic Date Due   Pneumonia Vaccine 66+ Years old (1 of 1 - PCV) 06/05/2023 (Originally 08/05/2005)   COVID-19 Vaccine (3 - 2024-25 season) 06/21/2023 (Originally 09/16/2022)   Medicare Annual Wellness (AWV)  07/06/2023 (Originally 12/19/2022)   Zoster Vaccines- Shingrix (1 of 2) 09/05/2023 (Originally 08/05/2005)   DTaP/Tdap/Td (1 - Tdap) 06/04/2024 (Originally 08/06/1974)   MAMMOGRAM  06/04/2024 (Originally 08/05/2005)   DEXA SCAN  06/04/2024 (Originally 08/30/2022)   Hepatitis C Screening  06/04/2024 (Originally 08/05/1973)  INFLUENZA VACCINE  08/16/2023   Colonoscopy  01/12/2031   HPV VACCINES  Aged Out   Meningococcal B Vaccine  Aged Out    Discussed health benefits of physical activity, and encouraged her to engage in regular exercise appropriate for her age and condition.  Problem List Items Addressed This Visit       Endocrine   Hypothyroidism   Relevant Orders   Thyroid  Panel With TSH     Other   Pure hypercholesterolemia   Relevant Orders   CMP14+EGFR   Lipid panel   Vitamin D  deficiency   Relevant Orders   CMP14+EGFR   VITAMIN D  25 Hydroxy (Vit-D Deficiency, Fractures)   Glaucoma   Relevant Medications   latanoprost (XALATAN) 0.005 % ophthalmic solution   Other Visit Diagnoses       Annual physical exam     -  Primary   Relevant Orders   CBC with Differential/Platelet   CMP14+EGFR   Hepatitis C Antibody     Hair loss       Relevant Medications   ketoconazole (NIZORAL) 2 % shampoo (Start on 06/06/2023)   Other Relevant Orders   CBC with Differential/Platelet   CMP14+EGFR   Thyroid  Panel With TSH   VITAMIN D  25 Hydroxy (Vit-D Deficiency, Fractures)   Vitamin B12     Need for hepatitis C screening test       Relevant Orders   Hepatitis C Antibody     Scalp pruritus       Relevant Medications   ketoconazole (NIZORAL) 2 % shampoo (Start on 06/06/2023)      Return in 6 months (on 12/06/2023), or if symptoms worsen or fail to improve, for chronic follow up.     Kattie Parrot, FNP

## 2023-06-05 NOTE — Patient Instructions (Signed)
 Goal BP:  For patients younger than 60: Goal BP < 140/90. For patients 60 and older: Goal BP < 150/90. For patients with diabetes: Goal BP < 140/90.  Take your medications faithfully as prescribed. Maintain a healthy weight. Get at least 150 minutes of aerobic exercise per week. Minimize salt intake, less than 2000 mg per day. Minimize alcohol intake.  DASH Eating Plan DASH stands for "Dietary Approaches to Stop Hypertension." The DASH eating plan is a healthy eating plan that has been shown to reduce high blood pressure (hypertension). Additional health benefits may include reducing the risk of type 2 diabetes mellitus, heart disease, and stroke. The DASH eating plan may also help with weight loss.  WHAT DO I NEED TO KNOW ABOUT THE DASH EATING PLAN? For the DASH eating plan, you will follow these general guidelines: Choose foods with a percent daily value for sodium of less than 5% (as listed on the food label). Use salt-free seasonings or herbs instead of table salt or sea salt. Check with your health care provider or pharmacist before using salt substitutes. Eat lower-sodium products, often labeled as "lower sodium" or "no salt added." Eat fresh foods. Eat more vegetables, fruits, and low-fat dairy products. Choose whole grains. Look for the word "whole" as the first word in the ingredient list. Choose fish and skinless chicken or [place] more often than red meat. Limit fish, poultry, and meat to 6 oz (170 g) each day. Limit sweets, desserts, sugars, and sugary drinks. Choose heart-healthy fats. Limit cheese to 1 oz (28 g) per day. Eat more home-cooked food and less restaurant, buffet, and fast food. Limit fried foods. Cook foods using methods other than frying. Limit canned vegetables. If you do use them, rinse them well to decrease the sodium. When eating at a restaurant, ask that your food be prepared with less salt, or no salt if possible.  WHAT FOODS CAN I EAT? Seek help from  a dietitian for individual calorie needs.  Grains Whole grain or whole wheat bread. Brown rice. Whole grain or whole wheat pasta. Quinoa, bulgur, and whole grain cereals. Low-sodium cereals. Corn or whole wheat flour tortillas. Whole grain cornbread. Whole grain crackers. Low-sodium crackers.  Vegetables Fresh or frozen vegetables (raw, steamed, roasted, or grilled). Low-sodium or reduced-sodium tomato and vegetable juices. Low-sodium or reduced-sodium tomato sauce and paste. Low-sodium or reduced-sodium canned vegetables.   Fruits All fresh, canned (in natural juice), or frozen fruits.  Meat and Other Protein Products Ground beef (85% or leaner), grass-fed beef, or beef trimmed of fat. Skinless chicken or [place]. Ground chicken or [place]. Pork trimmed of fat. All fish and seafood. Eggs. Dried beans, peas, or lentils. Unsalted nuts and seeds. Unsalted canned beans.  Dairy Low-fat dairy products, such as skim or 1% milk, 2% or reduced-fat cheeses, low-fat ricotta or cottage cheese, or plain low-fat yogurt. Low-sodium or reduced-sodium cheeses.  Fats and Oils Tub margarines without trans fats. Light or reduced-fat mayonnaise and salad dressings (reduced sodium). Avocado. Safflower, olive, or canola oils. Natural peanut or almond butter.  Other Unsalted popcorn and pretzels. The items listed above may not be a complete list of recommended foods or beverages. Contact your dietitian for more options.  WHAT FOODS ARE NOT RECOMMENDED?  Grains White bread. White pasta. White rice. Refined cornbread. Bagels and croissants. Crackers that contain trans fat.  Vegetables Creamed or fried vegetables. Vegetables in a cheese sauce. Regular canned vegetables. Regular canned tomato sauce and paste. Regular tomato and vegetable juices.  Fruits Dried fruits. Canned fruit in light or heavy syrup. Fruit juice.  Meat and Other Protein Products Fatty cuts of meat. Ribs, chicken wings, bacon, sausage,  bologna, salami, chitterlings, fatback, hot dogs, bratwurst, and packaged luncheon meats. Salted nuts and seeds. Canned beans with salt.  Dairy Whole or 2% milk, cream, half-and-half, and cream cheese. Whole-fat or sweetened yogurt. Full-fat cheeses or blue cheese. Nondairy creamers and whipped toppings. Processed cheese, cheese spreads, or cheese curds.  Condiments Onion and garlic salt, seasoned salt, table salt, and sea salt. Canned and packaged gravies. Worcestershire sauce. Tartar sauce. Barbecue sauce. Teriyaki sauce. Soy sauce, including reduced sodium. Steak sauce. Fish sauce. Oyster sauce. Cocktail sauce. Horseradish. Ketchup and mustard. Meat flavorings and tenderizers. Bouillon cubes. Hot sauce. Tabasco sauce. Marinades. Taco seasonings. Relishes.  Fats and Oils Butter, stick margarine, lard, shortening, ghee, and bacon fat. Coconut, palm kernel, or palm oils. Regular salad dressings.  Other Pickles and olives. Salted popcorn and pretzels.  The items listed above may not be a complete list of foods and beverages to avoid. Contact your dietitian for more information.  WHERE CAN I FIND MORE INFORMATION? National Heart, Lung, and Blood Institute: [email or web address] Document Released: 12/21/2010 Document Revised: 05/18/2013 Document Reviewed: 11/05/2012 [facility] Patient Information 2015 [place], [place]. This information is not intended to replace advice given to you by your health care provider. Make sure you discuss any questions you have with your health care provider.   I think that you would greatly benefit from seeing a nutritionist.  If you are interested, please call Dr. Gerilyn Pilgrim at [phone] to schedule an appointment.

## 2023-06-06 ENCOUNTER — Telehealth: Payer: Self-pay

## 2023-06-06 ENCOUNTER — Ambulatory Visit: Payer: Self-pay | Admitting: Family Medicine

## 2023-06-06 LAB — LIPID PANEL
Chol/HDL Ratio: 3.5 ratio (ref 0.0–4.4)
Cholesterol, Total: 236 mg/dL — ABNORMAL HIGH (ref 100–199)
HDL: 67 mg/dL (ref >39)
LDL Chol Calc (NIH): 155 mg/dL — ABNORMAL HIGH (ref 0–99)
Triglycerides: 79 mg/dL (ref 0–149)
VLDL Cholesterol Cal: 14 mg/dL (ref 5–40)

## 2023-06-06 LAB — CBC WITH DIFFERENTIAL/PLATELET
Basophils Absolute: 0 10*3/uL (ref 0.0–0.2)
Basos: 1 %
EOS (ABSOLUTE): 0.1 10*3/uL (ref 0.0–0.4)
Eos: 2 %
Hematocrit: 46.2 % (ref 34.0–46.6)
Hemoglobin: 14.8 g/dL (ref 11.1–15.9)
Immature Grans (Abs): 0 10*3/uL (ref 0.0–0.1)
Immature Granulocytes: 0 %
Lymphocytes Absolute: 1.8 10*3/uL (ref 0.7–3.1)
Lymphs: 29 %
MCH: 27.4 pg (ref 26.6–33.0)
MCHC: 32 g/dL (ref 31.5–35.7)
MCV: 85 fL (ref 79–97)
Monocytes Absolute: 0.4 10*3/uL (ref 0.1–0.9)
Monocytes: 7 %
Neutrophils Absolute: 3.8 10*3/uL (ref 1.4–7.0)
Neutrophils: 61 %
Platelets: 228 10*3/uL (ref 150–450)
RBC: 5.41 x10E6/uL — ABNORMAL HIGH (ref 3.77–5.28)
RDW: 13.8 % (ref 11.7–15.4)
WBC: 6.1 10*3/uL (ref 3.4–10.8)

## 2023-06-06 LAB — HEPATITIS C ANTIBODY: Hep C Virus Ab: NONREACTIVE

## 2023-06-06 LAB — THYROID PANEL WITH TSH
Free Thyroxine Index: 1.7 (ref 1.2–4.9)
T3 Uptake Ratio: 26 % (ref 24–39)
T4, Total: 6.4 ug/dL (ref 4.5–12.0)
TSH: 4.41 u[IU]/mL (ref 0.450–4.500)

## 2023-06-06 LAB — CMP14+EGFR
ALT: 25 IU/L (ref 0–32)
AST: 20 IU/L (ref 0–40)
Albumin: 4.4 g/dL (ref 3.9–4.9)
Alkaline Phosphatase: 73 IU/L (ref 44–121)
BUN/Creatinine Ratio: 22 (ref 12–28)
BUN: 19 mg/dL (ref 8–27)
Bilirubin Total: 0.4 mg/dL (ref 0.0–1.2)
CO2: 18 mmol/L — ABNORMAL LOW (ref 20–29)
Calcium: 9.3 mg/dL (ref 8.7–10.3)
Chloride: 107 mmol/L — ABNORMAL HIGH (ref 96–106)
Creatinine, Ser: 0.87 mg/dL (ref 0.57–1.00)
Globulin, Total: 2.6 g/dL (ref 1.5–4.5)
Glucose: 110 mg/dL — ABNORMAL HIGH (ref 70–99)
Potassium: 4 mmol/L (ref 3.5–5.2)
Sodium: 142 mmol/L (ref 134–144)
Total Protein: 7 g/dL (ref 6.0–8.5)
eGFR: 73 mL/min/{1.73_m2} (ref >59)

## 2023-06-06 LAB — VITAMIN B12: Vitamin B-12: 653 pg/mL (ref 232–1245)

## 2023-06-06 LAB — VITAMIN D 25 HYDROXY (VIT D DEFICIENCY, FRACTURES): Vit D, 25-Hydroxy: 39.4 ng/mL (ref 30.0–100.0)

## 2023-06-06 NOTE — Telephone Encounter (Signed)
 Copied from CRM [id number]. Topic: Clinical - Lab/Test Results >> Jun 06, 2023  1:28 PM Ethelle Herb L wrote: Reason for CRM:  Patient requesting copy of lab work and would like a call when ready for pick up. Requested copy of husband's lab work as well.

## 2023-06-06 NOTE — Telephone Encounter (Signed)
 Printed & placed up front.    Attempted to contact- mailbox full

## 2023-07-02 ENCOUNTER — Other Ambulatory Visit: Payer: Self-pay | Admitting: Family Medicine

## 2023-07-02 DIAGNOSIS — L659 Nonscarring hair loss, unspecified: Secondary | ICD-10-CM

## 2023-07-02 DIAGNOSIS — L299 Pruritus, unspecified: Secondary | ICD-10-CM

## 2023-07-18 DIAGNOSIS — M62830 Muscle spasm of back: Secondary | ICD-10-CM | POA: Diagnosis not present

## 2023-07-18 DIAGNOSIS — M9901 Segmental and somatic dysfunction of cervical region: Secondary | ICD-10-CM | POA: Diagnosis not present

## 2023-07-18 DIAGNOSIS — M9903 Segmental and somatic dysfunction of lumbar region: Secondary | ICD-10-CM | POA: Diagnosis not present

## 2023-07-18 DIAGNOSIS — M9902 Segmental and somatic dysfunction of thoracic region: Secondary | ICD-10-CM | POA: Diagnosis not present

## 2023-07-22 DIAGNOSIS — M9901 Segmental and somatic dysfunction of cervical region: Secondary | ICD-10-CM | POA: Diagnosis not present

## 2023-07-22 DIAGNOSIS — M9903 Segmental and somatic dysfunction of lumbar region: Secondary | ICD-10-CM | POA: Diagnosis not present

## 2023-07-22 DIAGNOSIS — M9902 Segmental and somatic dysfunction of thoracic region: Secondary | ICD-10-CM | POA: Diagnosis not present

## 2023-07-22 DIAGNOSIS — M62830 Muscle spasm of back: Secondary | ICD-10-CM | POA: Diagnosis not present

## 2023-07-31 ENCOUNTER — Ambulatory Visit: Payer: Self-pay

## 2023-07-31 NOTE — Telephone Encounter (Signed)
 Called patient to change appointment tomorrow to her provider since she is DOD with openings and patient declines saying she will keep appointment with Dr Zollie.

## 2023-07-31 NOTE — Telephone Encounter (Signed)
 Note: patient would prefer to see PCP, but no availability until August.   FYI Only or Action Required?: FYI only for provider.  Patient was last seen in primary care on 06/05/2023 by Severa Rock HERO, FNP.  Called Nurse Triage reporting Hypertension.  Symptoms began yesterday.  Interventions attempted: Nothing.  Symptoms are: unchanged.  Triage Disposition: See PCP When Office is Open (Within 3 Days)  Patient/caregiver understands and will follow disposition?: Yes  Copied from CRM [id number]. Topic: Clinical - Red Word Triage >> Jul 31, 2023 11:29 AM Tonda B wrote: [facility] that prompted transfer to Nurse Triage: patient blood pressure is high 170/85 Reason for Disposition  Systolic BP >= 160 OR Diastolic >= 100  Answer Assessment - Initial Assessment Questions 1. BLOOD PRESSURE: What is your blood pressure? Did you take at least two measurements 5 minutes apart?     167-170 systolic 2. ONSET: When did you take your blood pressure?     This morning 3. HOW: How did you take your blood pressure? (e.g., automatic home BP monitor, visiting nurse)     Via Machine 4. HISTORY: Do you have a history of high blood pressure?     No 5. MEDICINES: Are you taking any medicines for blood pressure? Have you missed any doses recently?     No 6. OTHER SYMPTOMS: Do you have any symptoms? (e.g., blurred vision, chest pain, difficulty breathing, headache, weakness)     Tired, run down  Protocols used: Blood Pressure - High-A-AH

## 2023-08-01 ENCOUNTER — Ambulatory Visit (INDEPENDENT_AMBULATORY_CARE_PROVIDER_SITE_OTHER): Admitting: Family Medicine

## 2023-08-01 ENCOUNTER — Encounter: Payer: Self-pay | Admitting: Family Medicine

## 2023-08-01 VITALS — BP 158/81 | HR 79 | Temp 97.9°F | Ht 61.0 in | Wt 190.0 lb

## 2023-08-01 DIAGNOSIS — E039 Hypothyroidism, unspecified: Secondary | ICD-10-CM | POA: Diagnosis not present

## 2023-08-01 DIAGNOSIS — I10 Essential (primary) hypertension: Secondary | ICD-10-CM | POA: Diagnosis not present

## 2023-08-01 MED ORDER — LISINOPRIL 10 MG PO TABS: 10.0000 mg | ORAL_TABLET | Freq: Every day | ORAL | 0 refills | Status: DC

## 2023-08-01 NOTE — Progress Notes (Signed)
 Subjective:  Patient ID: Ann Valdez, female    DOB: Jun 19, 1955  Age: 68 y.o. MRN: [id number]  CC: Hypertension (Always been a little high but here recently it was 170/ 165 over 70's. No headache just felt tired. )   HPI Ann Valdez presents for BP always high. Now 160-174 systolic.  Sometimes it is normal when she checks it.  Other times it can be quite high.  She has had no focal neurologic changes with any of the elevated blood pressure readings.  Specifically nothing referable to TIA or vision change, numbness weakness of the extremity.  No history of renal insufficiency.  Last EGFR was 73.  Uncontrolled hypertension most recent thyroid  check was actually a little bit toward the low side.     06/05/2023   10:11 AM 06/05/2022   10:43 AM 01/25/2022    2:28 PM  Depression screen PHQ 2/9  Decreased Interest 0 0 0  Down, Depressed, Hopeless 0 0 0  PHQ - 2 Score 0 0 0  Altered sleeping 0 0   Tired, decreased energy 0 0   Change in appetite 0 0   Feeling bad or failure about yourself  0 0   Trouble concentrating 0 0   Moving slowly or fidgety/restless 0 0   Suicidal thoughts 0 0   PHQ-9 Score 0 0   Difficult doing work/chores Not difficult at all Not difficult at all     History Ann Valdez has a past medical history of Medical history non-contributory.   She has a past surgical history that includes Cosmetic surgery and Colonoscopy (N/A, 01/11/2021).   Her family history is not on file.She reports that she has never smoked. She has never used smokeless tobacco. She reports that she does not drink alcohol and does not use drugs.    ROS Review of Systems  Constitutional: Negative.   HENT:  Negative for congestion.   Eyes:  Negative for visual disturbance.  Respiratory:  Negative for shortness of breath.   Cardiovascular:  Negative for chest pain.  Gastrointestinal:  Negative for abdominal pain, constipation, diarrhea, nausea and vomiting.  Genitourinary:  Negative for  difficulty urinating.  Musculoskeletal:  Negative for arthralgias and myalgias.  Neurological:  Negative for headaches.  Psychiatric/Behavioral:  Negative for sleep disturbance.     Objective:  BP (!) 158/81   Pulse 79   Temp 97.9 F (36.6 C)   Ht 5' 1 (1.549 m)   Wt 190 lb (86.2 kg)   SpO2 95%   BMI 35.90 kg/m   BP Readings from Last 3 Encounters:  08/01/23 (!) 158/81  06/05/23 138/78  02/06/23 (!) 146/77    Wt Readings from Last 3 Encounters:  08/01/23 190 lb (86.2 kg)  06/05/23 197 lb 3.2 oz (89.4 kg)  02/06/23 196 lb (88.9 kg)     Physical Exam Constitutional:      General: She is not in acute distress.    Appearance: She is well-developed.  Cardiovascular:     Rate and Rhythm: Normal rate and regular rhythm.  Pulmonary:     Breath sounds: Normal breath sounds.  Musculoskeletal:        General: Normal range of motion.  Skin:    General: Skin is warm and dry.  Neurological:     Mental Status: She is alert and oriented to person, place, and time.      Assessment & Plan:  Uncontrolled stage 2 hypertension  Essential hypertension  Acquired hypothyroidism  Other orders -  Lisinopril ; Take 1 tablet (10 mg total) by mouth daily.  Dispense: 90 tablet; Refill: 0     Follow-up: Return in about 1 month (around 09/01/2023), or With her PCP.  Butler Der, M.D.

## 2023-09-03 ENCOUNTER — Other Ambulatory Visit: Payer: Self-pay | Admitting: Family Medicine

## 2023-09-03 ENCOUNTER — Ambulatory Visit (INDEPENDENT_AMBULATORY_CARE_PROVIDER_SITE_OTHER): Admitting: Family Medicine

## 2023-09-03 ENCOUNTER — Encounter: Payer: Self-pay | Admitting: Family Medicine

## 2023-09-03 VITALS — BP 118/65 | HR 77 | Temp 97.7°F | Ht 61.0 in | Wt 192.2 lb

## 2023-09-03 DIAGNOSIS — I10 Essential (primary) hypertension: Secondary | ICD-10-CM | POA: Insufficient documentation

## 2023-09-03 DIAGNOSIS — L659 Nonscarring hair loss, unspecified: Secondary | ICD-10-CM

## 2023-09-03 DIAGNOSIS — R7309 Other abnormal glucose: Secondary | ICD-10-CM | POA: Diagnosis not present

## 2023-09-03 DIAGNOSIS — L299 Pruritus, unspecified: Secondary | ICD-10-CM | POA: Diagnosis not present

## 2023-09-03 LAB — BAYER DCA HB A1C WAIVED: HB A1C (BAYER DCA - WAIVED): 6 % — ABNORMAL HIGH (ref 4.8–5.6)

## 2023-09-03 MED ORDER — KETOCONAZOLE 2 % EX SHAM
1.0000 | MEDICATED_SHAMPOO | CUTANEOUS | 2 refills | Status: AC
Start: 2023-09-05 — End: ?

## 2023-09-03 MED ORDER — SEMAGLUTIDE-WEIGHT MANAGEMENT 0.25 MG/0.5ML ~~LOC~~ SOAJ: 0.2500 mg | SUBCUTANEOUS | 0 refills | Status: AC

## 2023-09-03 MED ORDER — LISINOPRIL 10 MG PO TABS
10.0000 mg | ORAL_TABLET | Freq: Every day | ORAL | 3 refills | Status: AC
Start: 2023-09-03 — End: ?

## 2023-09-03 NOTE — Telephone Encounter (Signed)
 WEGOVY  0.25 MG/0.5ML SOAJ SQ injection  Pharmacy comment: Alternative Requested:NOT COVERED BY INSURANCE.

## 2023-09-03 NOTE — Patient Instructions (Signed)

## 2023-09-03 NOTE — Progress Notes (Signed)
 Subjective:  Patient ID: Ann Valdez, female    DOB: Sep 07, 1955, 68 y.o.   MRN: [id number]  Patient Care Team: Severa Rock HERO, FNP as PCP - General (Family Medicine)   Chief Complaint:  Hypertension (4 week follow up )   HPI: Ann Valdez is a 68 y.o. female presenting on 09/03/2023 for Hypertension (4 week follow up )   Ann Valdez is a 68 year old female with hypertension who presents for a follow-up visit.  She takes lisinopril  10 mg daily for hypertension. Initially, she tried half a pill but noticed no difference, so she switched to a whole pill. She experiences no side effects such as headaches, leg swelling, cough, or shortness of breath. She has a sufficient supply of medication and will need a refill when it runs out.  She is concerned about her weight, noting dietary changes since August 1st, including eliminating bread and consuming more protein, salads, and less dessert. Despite these changes, she has not lost any weight. She engages in regular physical activity, including walking, home exercises, and swimming at the gym. Her weight has decreased from 197 lbs in May to 192 lbs today. She mentions a significant dietary change from her previous habits, which included more bread and baking.  She inquires about her thyroid  function due to concerns about hair loss and weight issues. She takes vitamin D  supplements. She also experiences pain on her side when stretching, which resolves when she slows down.  No headaches, leg swelling, cough, or shortness of breath. She reports hair loss and itching.          Relevant past medical, surgical, family, and social history reviewed and updated as indicated.  Allergies and medications reviewed and updated. Data reviewed: Chart in Epic.   Past Medical History:  Diagnosis Date   Medical history non-contributory     Past Surgical History:  Procedure Laterality Date   COLONOSCOPY N/A 01/11/2021   Procedure:  COLONOSCOPY;  Surgeon: Shaaron Lamar HERO, MD;  Location: [facility];  Service: Endoscopy;  Laterality: N/A;  7:30AM   COSMETIC SURGERY      Social History   Socioeconomic History   Marital status: Married    Spouse name: Not on file   Number of children: 3   Years of education: Not on file   Highest education level: 12th grade  Occupational History   Not on file  Tobacco Use   Smoking status: Never   Smokeless tobacco: Never  Vaping Use   Vaping status: Never Used  Substance and Sexual Activity   Alcohol use: Never   Drug use: Never   Sexual activity: Yes    Birth control/protection: None  Other Topics Concern   Not on file  Social History Narrative   Not on file   Social Drivers of Health   Financial Resource Strain: Low Risk  (12/18/2021)   Overall Financial Resource Strain (CARDIA)    Difficulty of Paying Living Expenses: Not hard at all  Food Insecurity: No Food Insecurity (12/18/2021)   Hunger Vital Sign    Worried About Running Out of Food in the Last Year: Never true    Ran Out of Food in the Last Year: Never true  Transportation Needs: No Transportation Needs (12/13/2020)   PRAPARE - Administrator, Civil Service (Medical): No    Lack of Transportation (Non-Medical): No  Physical Activity: Insufficiently Active (12/13/2020)   Exercise Vital Sign    Days  of Exercise per Week: 3 days    Minutes of Exercise per Session: 10 min  Stress: No Stress Concern Present (12/18/2021)   [employer] of Occupational Health - Occupational Stress Questionnaire    Feeling of Stress : Not at all  Social Connections: Moderately Isolated (12/18/2021)   Social Connection and Isolation Panel    Frequency of Communication with Friends and Family: More than three times a week    Frequency of Social Gatherings with Friends and Family: More than three times a week    Attends Religious Services: Never    Database administrator or Organizations: No    Attends Tax inspector Meetings: Never    Marital Status: Married  Catering manager Violence: Not At Risk (12/18/2021)   Humiliation, Afraid, Rape, and Kick questionnaire    Fear of Current or Ex-Partner: No    Emotionally Abused: No    Physically Abused: No    Sexually Abused: No    Outpatient Encounter Medications as of 09/03/2023  Medication Sig   bisacodyl  5 MG EC tablet Take 1 tablet (5 mg total) by mouth daily as needed for moderate constipation.   latanoprost (XALATAN) 0.005 % ophthalmic solution 1 drop at bedtime.   semaglutide -weight management (WEGOVY ) 0.25 MG/0.5ML SOAJ SQ injection Inject 0.25 mg into the skin once a week for 28 days.   [DISCONTINUED] ketoconazole  (NIZORAL ) 2 % shampoo APPLY 1 APPLICATION TOPICALLY 2 (TWO) TIMES A WEEK.   [DISCONTINUED] lisinopril  (ZESTRIL ) 10 MG tablet Take 1 tablet (10 mg total) by mouth daily.   [START ON 09/05/2023] ketoconazole  (NIZORAL ) 2 % shampoo Apply 1 Application topically 2 (two) times a week.   lisinopril  (ZESTRIL ) 10 MG tablet Take 1 tablet (10 mg total) by mouth daily.   No facility-administered encounter medications on file as of 09/03/2023.    Allergies  Allergen Reactions   Ciprofloxacin Rash and Itching   Tetracyclines & Related Other (See Comments)    Pertinent ROS per HPI, otherwise unremarkable      Objective:  BP 118/65   Pulse 77   Temp 97.7 F (36.5 C)   Ht 5' 1 (1.549 m)   Wt 192 lb 3.2 oz (87.2 kg)   SpO2 96%   BMI 36.32 kg/m    Wt Readings from Last 3 Encounters:  09/03/23 192 lb 3.2 oz (87.2 kg)  08/01/23 190 lb (86.2 kg)  06/05/23 197 lb 3.2 oz (89.4 kg)    Physical Exam Vitals and nursing note reviewed.  Constitutional:      Appearance: Normal appearance. She is well-developed and well-groomed. She is morbidly obese.  HENT:     Head: Normocephalic and atraumatic.     Mouth/Throat:     Mouth: Mucous membranes are moist.  Eyes:     Pupils: Pupils are equal, round, and reactive to light.   Cardiovascular:     Rate and Rhythm: Normal rate and regular rhythm.     Heart sounds: Normal heart sounds.  Pulmonary:     Effort: Pulmonary effort is normal.     Breath sounds: Normal breath sounds.  Musculoskeletal:     Cervical back: Neck supple.     Right lower leg: No edema.     Left lower leg: No edema.  Skin:    General: Skin is warm and dry.     Capillary Refill: Capillary refill takes less than 2 seconds.  Neurological:     General: No focal deficit present.     Mental Status:  She is alert and oriented to person, place, and time.  Psychiatric:        Mood and Affect: Mood normal.        Behavior: Behavior normal. Behavior is cooperative.        Thought Content: Thought content normal.        Judgment: Judgment normal.     Results for orders placed or performed in visit on 06/05/23  CBC with Differential/Platelet   Collection Time: 06/05/23 10:34 AM  Result Value Ref Range   WBC 6.1 3.4 - 10.8 x10E3/uL   RBC 5.41 (H) 3.77 - 5.28 x10E6/uL   Hemoglobin 14.8 11.1 - 15.9 g/dL   Hematocrit 53.7 65.9 - 46.6 %   MCV 85 79 - 97 fL   MCH 27.4 26.6 - 33.0 pg   MCHC 32.0 31.5 - 35.7 g/dL   RDW 86.1 88.2 - 84.5 %   Platelets 228 150 - 450 x10E3/uL   Neutrophils 61 Not Estab. %   Lymphs 29 Not Estab. %   Monocytes 7 Not Estab. %   Eos 2 Not Estab. %   Basos 1 Not Estab. %   Neutrophils Absolute 3.8 1.4 - 7.0 x10E3/uL   Lymphocytes Absolute 1.8 0.7 - 3.1 x10E3/uL   Monocytes Absolute 0.4 0.1 - 0.9 x10E3/uL   EOS (ABSOLUTE) 0.1 0.0 - 0.4 x10E3/uL   Basophils Absolute 0.0 0.0 - 0.2 x10E3/uL   Immature Granulocytes 0 Not Estab. %   Immature Grans (Abs) 0.0 0.0 - 0.1 x10E3/uL  CMP14+EGFR   Collection Time: 06/05/23 10:34 AM  Result Value Ref Range   Glucose 110 (H) 70 - 99 mg/dL   BUN 19 8 - 27 mg/dL   Creatinine, Ser 9.12 0.57 - 1.00 mg/dL   eGFR 73 >40 fO/fpw/8.26   BUN/Creatinine Ratio 22 12 - 28   Sodium 142 134 - 144 mmol/L   Potassium 4.0 3.5 - 5.2 mmol/L    Chloride 107 (H) 96 - 106 mmol/L   CO2 18 (L) 20 - 29 mmol/L   Calcium  9.3 8.7 - 10.3 mg/dL   Total Protein 7.0 6.0 - 8.5 g/dL   Albumin 4.4 3.9 - 4.9 g/dL   Globulin, Total 2.6 1.5 - 4.5 g/dL   Bilirubin Total 0.4 0.0 - 1.2 mg/dL   Alkaline Phosphatase 73 44 - 121 IU/L   AST 20 0 - 40 IU/L   ALT 25 0 - 32 IU/L  Lipid panel   Collection Time: 06/05/23 10:34 AM  Result Value Ref Range   Cholesterol, Total 236 (H) 100 - 199 mg/dL   Triglycerides 79 0 - 149 mg/dL   HDL 67 >60 mg/dL   VLDL Cholesterol Cal 14 5 - 40 mg/dL   LDL Chol Calc (NIH) 844 (H) 0 - 99 mg/dL   Chol/HDL Ratio 3.5 0.0 - 4.4 ratio  Thyroid  Panel With TSH   Collection Time: 06/05/23 10:34 AM  Result Value Ref Range   TSH 4.410 0.450 - 4.500 uIU/mL   T4, Total 6.4 4.5 - 12.0 ug/dL   T3 Uptake Ratio 26 24 - 39 %   Free Thyroxine Index 1.7 1.2 - 4.9  VITAMIN D  25 Hydroxy (Vit-D Deficiency, Fractures)   Collection Time: 06/05/23 10:34 AM  Result Value Ref Range   Vit D, 25-Hydroxy 39.4 30.0 - 100.0 ng/mL  Hepatitis C Antibody   Collection Time: 06/05/23 10:34 AM  Result Value Ref Range   Hep C Virus Ab Non Reactive Non Reactive  Vitamin B12  Collection Time: 06/05/23 10:34 AM  Result Value Ref Range   Vitamin B-12 653 232 - 1,245 pg/mL       Pertinent labs & imaging results that were available during my care of the patient were reviewed by me and considered in my medical decision making.  Assessment & Plan:  Ann Valdez was seen today for hypertension.  Diagnoses and all orders for this visit:  Primary hypertension -     BMP8+EGFR -     lisinopril  (ZESTRIL ) 10 MG tablet; Take 1 tablet (10 mg total) by mouth daily. -     Thyroid  Panel With TSH -     semaglutide -weight management (WEGOVY ) 0.25 MG/0.5ML SOAJ SQ injection; Inject 0.25 mg into the skin once a week for 28 days.  Obesity, morbid (HCC) -     BMP8+EGFR -     Thyroid  Panel With TSH -     Bayer DCA Hb A1c Waived -     semaglutide -weight management  (WEGOVY ) 0.25 MG/0.5ML SOAJ SQ injection; Inject 0.25 mg into the skin once a week for 28 days.  Hair loss -     BMP8+EGFR -     Thyroid  Panel With TSH -     ketoconazole  (NIZORAL ) 2 % shampoo; Apply 1 Application topically 2 (two) times a week.  Scalp pruritus -     ketoconazole  (NIZORAL ) 2 % shampoo; Apply 1 Application topically 2 (two) times a week.     Obesity Difficulty losing weight despite dietary changes and increased physical activity. Weight decreased from 197 lbs in May to 192 lbs today. Possible thyroid  dysfunction being evaluated. - Order thyroid  function tests - Provide DASH diet plan - Submit prior authorization for weight loss medication to check insurance coverage  Evaluation for possible hypothyroidism Reports hair loss and difficulty losing weight, possibly related to thyroid  dysfunction. - Order thyroid  function tests  Alopecia Vitamin D  supplementation is being taken. - Send prescription for shampoo - Continue vitamin D  supplementation  Hypertension Well-controlled with lisinopril  10 mg daily. Blood pressure is 118/65 mmHg. No side effects such as headaches, leg swelling, cough, or shortness of breath. Monitoring kidney function due to potential side effects of lisinopril . - Continue lisinopril  10 mg daily - Order BMP to monitor kidney function - Ensure lisinopril  prescription has refills  Muscle strain of back Pain in the side of the back when stretching, likely due to a pulled muscle. Pain resolves with rest.          Continue all other maintenance medications.  Follow up plan: Return in about 6 months (around 03/05/2024), or if symptoms worsen or fail to improve, for chronic follow up .   Continue healthy lifestyle choices, including diet (rich in fruits, vegetables, and lean proteins, and low in salt and simple carbohydrates) and exercise (at least 30 minutes of moderate physical activity daily).  Educational handout given for DASH  The above  assessment and management plan was discussed with the patient. The patient verbalized understanding of and has agreed to the management plan. Patient is aware to call the clinic if they develop any new symptoms or if symptoms persist or worsen. Patient is aware when to return to the clinic for a follow-up visit. Patient educated on when it is appropriate to go to the emergency department.   Rosaline Bruns, FNP-C [facility] [phone]

## 2023-09-04 ENCOUNTER — Ambulatory Visit: Payer: Self-pay | Admitting: Family Medicine

## 2023-09-04 LAB — BMP8+EGFR
BUN/Creatinine Ratio: 29 — ABNORMAL HIGH (ref 12–28)
BUN: 23 mg/dL (ref 8–27)
CO2: 19 mmol/L — ABNORMAL LOW (ref 20–29)
Calcium: 9.9 mg/dL (ref 8.7–10.3)
Chloride: 104 mmol/L (ref 96–106)
Creatinine, Ser: 0.79 mg/dL (ref 0.57–1.00)
Glucose: 101 mg/dL — ABNORMAL HIGH (ref 70–99)
Potassium: 4.5 mmol/L (ref 3.5–5.2)
Sodium: 140 mmol/L (ref 134–144)
eGFR: 81 mL/min/1.73 (ref >59)

## 2023-09-04 LAB — THYROID PANEL WITH TSH
Free Thyroxine Index: 1.4 (ref 1.2–4.9)
T3 Uptake Ratio: 24 % (ref 24–39)
T4, Total: 5.7 ug/dL (ref 4.5–12.0)
TSH: 3.09 u[IU]/mL (ref 0.450–4.500)

## 2023-09-04 NOTE — Telephone Encounter (Signed)
 Patient aware and verbalizes understanding.

## 2023-09-11 ENCOUNTER — Other Ambulatory Visit: Payer: Self-pay | Admitting: Family Medicine

## 2023-09-11 DIAGNOSIS — I10 Essential (primary) hypertension: Secondary | ICD-10-CM

## 2023-09-12 NOTE — Telephone Encounter (Signed)
 Needs PA per telephone note

## 2023-09-25 ENCOUNTER — Other Ambulatory Visit (HOSPITAL_COMMUNITY): Payer: Self-pay

## 2023-09-30 ENCOUNTER — Other Ambulatory Visit (HOSPITAL_COMMUNITY): Payer: Self-pay

## 2023-09-30 ENCOUNTER — Telehealth: Payer: Self-pay | Admitting: Pharmacy Technician

## 2023-09-30 NOTE — Telephone Encounter (Addendum)
 Pharmacy Patient Advocate Encounter   Received notification from RX Requests that prior authorization for Wegovy  is required/requested.   Insurance verification completed.   The patient is insured through [employer] .   Unfortunately Medicare Part D doesn't cover this medication for weight-loss indication.  Wegovy  is now covered by Medicare Part D plans if it's prescribed for an FDA-approved indication, such as reducing the risk of heart attacks and strokes in adults with established cardiovascular disease and who are also overweight or have obesity.   Looks like on 8/20 [employer] notated that it's not covered and to inform the pt.

## 2024-03-05 ENCOUNTER — Ambulatory Visit: Payer: Self-pay | Admitting: Family Medicine

## 2024-06-05 ENCOUNTER — Encounter: Payer: Self-pay | Admitting: Family Medicine
